# Patient Record
Sex: Female | Born: 1955 | Race: White | Hispanic: No | Marital: Single | Smoking: Former smoker
Health system: Southern US, Community
[De-identification: ages and names within clinical notes are randomized; demographics above are authoritative.]

## PROBLEM LIST (undated history)

## (undated) DIAGNOSIS — I10 Essential (primary) hypertension: Secondary | ICD-10-CM

## (undated) DIAGNOSIS — I639 Cerebral infarction, unspecified: Secondary | ICD-10-CM

## (undated) HISTORY — PX: INDUCED ABORTION: SHX677

---

## 2014-03-22 DIAGNOSIS — I639 Cerebral infarction, unspecified: Secondary | ICD-10-CM

## 2014-03-22 HISTORY — DX: Cerebral infarction, unspecified: I63.9

## 2015-02-19 ENCOUNTER — Inpatient Hospital Stay (HOSPITAL_COMMUNITY)
Admission: EM | Admit: 2015-02-19 | Discharge: 2015-02-25 | DRG: 023 | Disposition: A | Discharge status: Skilled Nursing Facility | Payer: Medicaid - Out of State | Attending: Neurology | Admitting: Neurology

## 2015-02-19 ENCOUNTER — Encounter (HOSPITAL_COMMUNITY): Payer: Self-pay | Admitting: Radiology

## 2015-02-19 ENCOUNTER — Inpatient Hospital Stay (HOSPITAL_COMMUNITY): Payer: Medicaid - Out of State | Admitting: Certified Registered"

## 2015-02-19 ENCOUNTER — Inpatient Hospital Stay (HOSPITAL_COMMUNITY): Payer: Medicaid - Out of State

## 2015-02-19 ENCOUNTER — Emergency Department (HOSPITAL_COMMUNITY): Payer: Medicaid - Out of State

## 2015-02-19 ENCOUNTER — Encounter (HOSPITAL_COMMUNITY)
Admission: EM | Disposition: A | Discharge status: Skilled Nursing Facility | Payer: Self-pay | Source: Home / Self Care | Attending: Neurology

## 2015-02-19 DIAGNOSIS — I348 Other nonrheumatic mitral valve disorders: Secondary | ICD-10-CM | POA: Diagnosis not present

## 2015-02-19 DIAGNOSIS — R531 Weakness: Secondary | ICD-10-CM | POA: Diagnosis present

## 2015-02-19 DIAGNOSIS — Z590 Homelessness: Secondary | ICD-10-CM

## 2015-02-19 DIAGNOSIS — I424 Endocardial fibroelastosis: Secondary | ICD-10-CM | POA: Diagnosis present

## 2015-02-19 DIAGNOSIS — D649 Anemia, unspecified: Secondary | ICD-10-CM | POA: Diagnosis present

## 2015-02-19 DIAGNOSIS — Q251 Coarctation of aorta: Secondary | ICD-10-CM

## 2015-02-19 DIAGNOSIS — I619 Nontraumatic intracerebral hemorrhage, unspecified: Secondary | ICD-10-CM | POA: Diagnosis present

## 2015-02-19 DIAGNOSIS — R4701 Aphasia: Secondary | ICD-10-CM | POA: Diagnosis present

## 2015-02-19 DIAGNOSIS — R414 Neurologic neglect syndrome: Secondary | ICD-10-CM | POA: Diagnosis present

## 2015-02-19 DIAGNOSIS — R1312 Dysphagia, oropharyngeal phase: Secondary | ICD-10-CM | POA: Diagnosis present

## 2015-02-19 DIAGNOSIS — R000 Tachycardia, unspecified: Secondary | ICD-10-CM | POA: Diagnosis not present

## 2015-02-19 DIAGNOSIS — E785 Hyperlipidemia, unspecified: Secondary | ICD-10-CM | POA: Diagnosis present

## 2015-02-19 DIAGNOSIS — Z87891 Personal history of nicotine dependence: Secondary | ICD-10-CM | POA: Diagnosis not present

## 2015-02-19 DIAGNOSIS — J9601 Acute respiratory failure with hypoxia: Secondary | ICD-10-CM | POA: Diagnosis not present

## 2015-02-19 DIAGNOSIS — R29719 NIHSS score 19: Secondary | ICD-10-CM | POA: Diagnosis present

## 2015-02-19 DIAGNOSIS — D151 Benign neoplasm of heart: Secondary | ICD-10-CM | POA: Diagnosis present

## 2015-02-19 DIAGNOSIS — J9690 Respiratory failure, unspecified, unspecified whether with hypoxia or hypercapnia: Secondary | ICD-10-CM

## 2015-02-19 DIAGNOSIS — I5189 Other ill-defined heart diseases: Secondary | ICD-10-CM | POA: Diagnosis present

## 2015-02-19 DIAGNOSIS — I69351 Hemiplegia and hemiparesis following cerebral infarction affecting right dominant side: Secondary | ICD-10-CM | POA: Diagnosis not present

## 2015-02-19 DIAGNOSIS — R339 Retention of urine, unspecified: Secondary | ICD-10-CM | POA: Diagnosis not present

## 2015-02-19 DIAGNOSIS — I63412 Cerebral infarction due to embolism of left middle cerebral artery: Secondary | ICD-10-CM | POA: Diagnosis present

## 2015-02-19 DIAGNOSIS — I10 Essential (primary) hypertension: Secondary | ICD-10-CM | POA: Diagnosis present

## 2015-02-19 DIAGNOSIS — E041 Nontoxic single thyroid nodule: Secondary | ICD-10-CM | POA: Diagnosis present

## 2015-02-19 DIAGNOSIS — I6789 Other cerebrovascular disease: Secondary | ICD-10-CM | POA: Diagnosis not present

## 2015-02-19 DIAGNOSIS — I611 Nontraumatic intracerebral hemorrhage in hemisphere, cortical: Secondary | ICD-10-CM | POA: Diagnosis not present

## 2015-02-19 DIAGNOSIS — R230 Cyanosis: Secondary | ICD-10-CM | POA: Diagnosis not present

## 2015-02-19 DIAGNOSIS — G936 Cerebral edema: Secondary | ICD-10-CM | POA: Diagnosis present

## 2015-02-19 DIAGNOSIS — R739 Hyperglycemia, unspecified: Secondary | ICD-10-CM | POA: Diagnosis present

## 2015-02-19 DIAGNOSIS — Z888 Allergy status to other drugs, medicaments and biological substances status: Secondary | ICD-10-CM

## 2015-02-19 DIAGNOSIS — M359 Systemic involvement of connective tissue, unspecified: Secondary | ICD-10-CM | POA: Diagnosis not present

## 2015-02-19 DIAGNOSIS — G9340 Encephalopathy, unspecified: Secondary | ICD-10-CM | POA: Diagnosis present

## 2015-02-19 DIAGNOSIS — T45615A Adverse effect of thrombolytic drugs, initial encounter: Secondary | ICD-10-CM | POA: Diagnosis not present

## 2015-02-19 DIAGNOSIS — I639 Cerebral infarction, unspecified: Secondary | ICD-10-CM | POA: Diagnosis present

## 2015-02-19 DIAGNOSIS — N939 Abnormal uterine and vaginal bleeding, unspecified: Secondary | ICD-10-CM | POA: Diagnosis present

## 2015-02-19 DIAGNOSIS — I610 Nontraumatic intracerebral hemorrhage in hemisphere, subcortical: Secondary | ICD-10-CM | POA: Diagnosis not present

## 2015-02-19 HISTORY — PX: RADIOLOGY WITH ANESTHESIA: SHX6223

## 2015-02-19 HISTORY — DX: Cerebral infarction, unspecified: I63.9

## 2015-02-19 HISTORY — DX: Essential (primary) hypertension: I10

## 2015-02-19 LAB — COMPREHENSIVE METABOLIC PANEL
ALT: 16 U/L (ref 14–54)
ANION GAP: 10 (ref 5–15)
AST: 23 U/L (ref 15–41)
Albumin: 3.9 g/dL (ref 3.5–5.0)
Alkaline Phosphatase: 100 U/L (ref 38–126)
BILIRUBIN TOTAL: 0.4 mg/dL (ref 0.3–1.2)
BUN: 10 mg/dL (ref 6–20)
CO2: 28 mmol/L (ref 22–32)
Calcium: 10.2 mg/dL (ref 8.9–10.3)
Chloride: 101 mmol/L (ref 101–111)
Creatinine, Ser: 0.96 mg/dL (ref 0.44–1.00)
GFR calc Af Amer: >60 mL/min (ref >60)
Glucose, Bld: 121 mg/dL — ABNORMAL HIGH (ref 65–99)
POTASSIUM: 3.7 mmol/L (ref 3.5–5.1)
Sodium: 139 mmol/L (ref 135–145)
TOTAL PROTEIN: 7.5 g/dL (ref 6.5–8.1)

## 2015-02-19 LAB — CBC
HCT: 40.3 % (ref 36.0–46.0)
HCT: 37.1 % (ref 36.0–46.0)
HCT: 32 % — ABNORMAL LOW (ref 36.0–46.0)
HEMATOCRIT: 35 % — AB (ref 36.0–46.0)
HEMATOCRIT: 32.5 % — AB (ref 36.0–46.0)
HEMOGLOBIN: 13.4 g/dL (ref 12.0–15.0)
HEMOGLOBIN: 12.6 g/dL (ref 12.0–15.0)
HEMOGLOBIN: 11.7 g/dL — AB (ref 12.0–15.0)
HEMOGLOBIN: 10.8 g/dL — AB (ref 12.0–15.0)
Hemoglobin: 10.5 g/dL — ABNORMAL LOW (ref 12.0–15.0)
MCH: 27.5 pg (ref 26.0–34.0)
MCH: 27.5 pg (ref 26.0–34.0)
MCH: 27.3 pg (ref 26.0–34.0)
MCH: 27.5 pg (ref 26.0–34.0)
MCH: 27.1 pg (ref 26.0–34.0)
MCHC: 33.3 g/dL (ref 30.0–36.0)
MCHC: 34 g/dL (ref 30.0–36.0)
MCHC: 33.4 g/dL (ref 30.0–36.0)
MCHC: 33.2 g/dL (ref 30.0–36.0)
MCHC: 32.8 g/dL (ref 30.0–36.0)
MCV: 82.6 fL (ref 78.0–100.0)
MCV: 80.8 fL (ref 78.0–100.0)
MCV: 81.8 fL (ref 78.0–100.0)
MCV: 82.7 fL (ref 78.0–100.0)
MCV: 82.7 fL (ref 78.0–100.0)
PLATELETS: 179 10*3/uL (ref 150–400)
Platelets: 178 10*3/uL (ref 150–400)
Platelets: 169 10*3/uL (ref 150–400)
Platelets: 184 10*3/uL (ref 150–400)
Platelets: 187 10*3/uL (ref 150–400)
RBC: 4.88 MIL/uL (ref 3.87–5.11)
RBC: 4.59 MIL/uL (ref 3.87–5.11)
RBC: 4.28 MIL/uL (ref 3.87–5.11)
RBC: 3.93 MIL/uL (ref 3.87–5.11)
RBC: 3.87 MIL/uL (ref 3.87–5.11)
RDW: 13.7 % (ref 11.5–15.5)
RDW: 13.8 % (ref 11.5–15.5)
RDW: 13.7 % (ref 11.5–15.5)
RDW: 13.7 % (ref 11.5–15.5)
RDW: 13.8 % (ref 11.5–15.5)
WBC: 10.2 10*3/uL (ref 4.0–10.5)
WBC: 10.9 10*3/uL — AB (ref 4.0–10.5)
WBC: 13.4 10*3/uL — AB (ref 4.0–10.5)
WBC: 13.3 10*3/uL — ABNORMAL HIGH (ref 4.0–10.5)
WBC: 11.7 10*3/uL — ABNORMAL HIGH (ref 4.0–10.5)

## 2015-02-19 LAB — POCT I-STAT 3, ART BLOOD GAS (G3+)
ACID-BASE EXCESS: 1 mmol/L (ref 0.0–2.0)
Acid-Base Excess: 2 mmol/L (ref 0.0–2.0)
BICARBONATE: 27 meq/L — AB (ref 20.0–24.0)
Bicarbonate: 24 mEq/L (ref 20.0–24.0)
O2 SAT: 99 %
O2 Saturation: 100 %
PO2 ART: 352 mmHg — AB (ref 80.0–100.0)
TCO2: 25 mmol/L (ref 0–100)
TCO2: 28 mmol/L (ref 0–100)
pCO2 arterial: 31.4 mmHg — ABNORMAL LOW (ref 35.0–45.0)
pCO2 arterial: 41.1 mmHg (ref 35.0–45.0)
pH, Arterial: 7.492 — ABNORMAL HIGH (ref 7.350–7.450)
pH, Arterial: 7.423 (ref 7.350–7.450)
pO2, Arterial: 143 mmHg — ABNORMAL HIGH (ref 80.0–100.0)

## 2015-02-19 LAB — I-STAT CHEM 8, ED
BUN: 13 mg/dL (ref 6–20)
CALCIUM ION: 1.23 mmol/L (ref 1.12–1.23)
Chloride: 100 mmol/L — ABNORMAL LOW (ref 101–111)
Creatinine, Ser: 1 mg/dL (ref 0.44–1.00)
GLUCOSE: 122 mg/dL — AB (ref 65–99)
HCT: 43 % (ref 36.0–46.0)
HEMOGLOBIN: 14.6 g/dL (ref 12.0–15.0)
Potassium: 3.7 mmol/L (ref 3.5–5.1)
Sodium: 140 mmol/L (ref 135–145)
TCO2: 28 mmol/L (ref 0–100)

## 2015-02-19 LAB — DIFFERENTIAL
Basophils Absolute: 0.1 10*3/uL (ref 0.0–0.1)
Basophils Relative: 1 %
EOS ABS: 0.4 10*3/uL (ref 0.0–0.7)
EOS PCT: 4 %
LYMPHS ABS: 4.2 10*3/uL — AB (ref 0.7–4.0)
Lymphocytes Relative: 41 %
MONOS PCT: 6 %
Monocytes Absolute: 0.6 10*3/uL (ref 0.1–1.0)
NEUTROS PCT: 48 %
Neutro Abs: 5 10*3/uL (ref 1.7–7.7)

## 2015-02-19 LAB — PROTIME-INR
INR: 1.11 (ref 0.00–1.49)
Prothrombin Time: 14.5 seconds (ref 11.6–15.2)

## 2015-02-19 LAB — I-STAT TROPONIN, ED: TROPONIN I, POC: 0.52 ng/mL — AB (ref 0.00–0.08)

## 2015-02-19 LAB — APTT: aPTT: 25 seconds (ref 24–37)

## 2015-02-19 LAB — TRIGLYCERIDES: TRIGLYCERIDES: 448 mg/dL — AB (ref <150)

## 2015-02-19 LAB — CBG MONITORING, ED: GLUCOSE-CAPILLARY: 112 mg/dL — AB (ref 65–99)

## 2015-02-19 SURGERY — RADIOLOGY WITH ANESTHESIA
Anesthesia: General

## 2015-02-19 MED ORDER — CEFAZOLIN SODIUM-DEXTROSE 2-3 GM-% IV SOLR
INTRAVENOUS | Status: DC | PRN
  Administered 2015-02-19: 2 g via INTRAVENOUS

## 2015-02-19 MED ORDER — NITROGLYCERIN 1 MG/10 ML FOR IR/CATH LAB
INTRA_ARTERIAL | Status: AC
  Filled 2015-02-19: qty 10

## 2015-02-19 MED ORDER — ESMOLOL HCL 100 MG/10ML IV SOLN
INTRAVENOUS | Status: DC | PRN
  Administered 2015-02-19: 40 mg via INTRAVENOUS
  Administered 2015-02-19 (×2): 30 mg via INTRAVENOUS

## 2015-02-19 MED ORDER — ONDANSETRON HCL 4 MG/2ML IJ SOLN: 4.0000 mg | Freq: Four times a day (QID) | INTRAMUSCULAR | Status: DC | PRN

## 2015-02-19 MED ORDER — LIDOCAINE HCL (CARDIAC) 20 MG/ML IV SOLN
INTRAVENOUS | Status: DC | PRN
  Administered 2015-02-19: 100 mg via INTRAVENOUS

## 2015-02-19 MED ORDER — LACTATED RINGERS IV SOLN
INTRAVENOUS | Status: DC | PRN
Start: 2015-02-19 — End: 2015-02-19
  Administered 2015-02-19: 06:00:00 via INTRAVENOUS

## 2015-02-19 MED ORDER — NICARDIPINE HCL IN NACL 20-0.86 MG/200ML-% IV SOLN: 3.0000 mg/h | INTRAVENOUS | Status: DC

## 2015-02-19 MED ORDER — ACETAMINOPHEN 500 MG PO TABS: 1000.0000 mg | ORAL_TABLET | Freq: Four times a day (QID) | ORAL | Status: DC | PRN

## 2015-02-19 MED ORDER — PROPOFOL 10 MG/ML IV BOLUS
INTRAVENOUS | Status: AC
  Filled 2015-02-19: qty 20

## 2015-02-19 MED ORDER — PROPOFOL 500 MG/50ML IV EMUL
INTRAVENOUS | Status: DC | PRN
  Administered 2015-02-19: 08:00:00 via INTRAVENOUS
  Administered 2015-02-19: 50 ug/kg/min via INTRAVENOUS

## 2015-02-19 MED ORDER — SENNOSIDES-DOCUSATE SODIUM 8.6-50 MG PO TABS
1.0000 | ORAL_TABLET | Freq: Every evening | ORAL | Status: DC | PRN
  Filled 2015-02-19: qty 1

## 2015-02-19 MED ORDER — SODIUM CHLORIDE 0.9 % IV SOLN
INTRAVENOUS | Status: DC
  Administered 2015-02-19 (×2): via INTRAVENOUS
  Administered 2015-02-20: 1000 mL via INTRAVENOUS
  Administered 2015-02-21 – 2015-02-22 (×2): via INTRAVENOUS

## 2015-02-19 MED ORDER — ALTEPLASE 30 MG/30 ML FOR INTERV. RAD
1.0000 mg | INTRA_ARTERIAL | Status: AC
  Administered 2015-02-19: 2.4 mg via INTRA_ARTERIAL
  Filled 2015-02-19: qty 30

## 2015-02-19 MED ORDER — LABETALOL HCL 5 MG/ML IV SOLN: 10.0000 mg | INTRAVENOUS | Status: DC | PRN

## 2015-02-19 MED ORDER — CHLORHEXIDINE GLUCONATE 0.12 % MT SOLN
15.0000 mL | Freq: Two times a day (BID) | OROMUCOSAL | Status: DC
  Administered 2015-02-19 – 2015-02-20 (×4): 15 mL via OROMUCOSAL

## 2015-02-19 MED ORDER — FENTANYL CITRATE (PF) 250 MCG/5ML IJ SOLN
INTRAMUSCULAR | Status: DC | PRN
  Administered 2015-02-19 (×2): 25 ug via INTRAVENOUS
  Administered 2015-02-19: 50 ug via INTRAVENOUS
  Administered 2015-02-19: 25 ug via INTRAVENOUS
  Administered 2015-02-19: 100 ug via INTRAVENOUS
  Administered 2015-02-19: 25 ug via INTRAVENOUS

## 2015-02-19 MED ORDER — ACETAMINOPHEN 650 MG RE SUPP
650.0000 mg | Freq: Four times a day (QID) | RECTAL | Status: DC | PRN
  Administered 2015-02-22: 650 mg via RECTAL
  Filled 2015-02-19: qty 1

## 2015-02-19 MED ORDER — PANTOPRAZOLE SODIUM 40 MG IV SOLR
40.0000 mg | Freq: Every day | INTRAVENOUS | Status: DC
Start: 2015-02-19 — End: 2015-02-25
  Administered 2015-02-19 – 2015-02-24 (×6): 40 mg via INTRAVENOUS
  Filled 2015-02-19 (×7): qty 40

## 2015-02-19 MED ORDER — NICARDIPINE HCL IN NACL 20-0.86 MG/200ML-% IV SOLN
5.0000 mg/h | INTRAVENOUS | Status: DC
  Administered 2015-02-19: 5 mg/h via INTRAVENOUS
  Filled 2015-02-19 (×2): qty 200

## 2015-02-19 MED ORDER — PROPOFOL 10 MG/ML IV BOLUS
INTRAVENOUS | Status: DC | PRN
  Administered 2015-02-19: 140 mg via INTRAVENOUS

## 2015-02-19 MED ORDER — ALTEPLASE (STROKE) FULL DOSE INFUSION
0.9000 mg/kg | Freq: Once | INTRAVENOUS | Status: AC
  Administered 2015-02-19: 57 mg via INTRAVENOUS
  Filled 2015-02-19: qty 57

## 2015-02-19 MED ORDER — SODIUM CHLORIDE 0.9 % IV SOLN
50.0000 mL | Freq: Once | INTRAVENOUS | Status: AC
  Administered 2015-02-19: 50 mL via INTRAVENOUS

## 2015-02-19 MED ORDER — PHENYLEPHRINE HCL 10 MG/ML IJ SOLN
10.0000 mg | INTRAVENOUS | Status: DC | PRN
  Administered 2015-02-19: 15 ug/min via INTRAVENOUS

## 2015-02-19 MED ORDER — FENTANYL CITRATE (PF) 250 MCG/5ML IJ SOLN
INTRAMUSCULAR | Status: AC
  Filled 2015-02-19: qty 5

## 2015-02-19 MED ORDER — HEPARIN SODIUM (PORCINE) 1000 UNIT/ML IJ SOLN
INTRAMUSCULAR | Status: AC
  Filled 2015-02-19: qty 1

## 2015-02-19 MED ORDER — NICARDIPINE HCL IN NACL 40-0.83 MG/200ML-% IV SOLN
5.0000 mg/h | INTRAVENOUS | Status: DC
  Administered 2015-02-19: 10 mg/h via INTRAVENOUS
  Administered 2015-02-19 – 2015-02-20 (×2): 5 mg/h via INTRAVENOUS
  Filled 2015-02-19 (×4): qty 200

## 2015-02-19 MED ORDER — PROPOFOL 1000 MG/100ML IV EMUL
INTRAVENOUS | Status: AC
  Administered 2015-02-19: 1000 mg
  Filled 2015-02-19: qty 100

## 2015-02-19 MED ORDER — CETYLPYRIDINIUM CHLORIDE 0.05 % MT LIQD
7.0000 mL | Freq: Two times a day (BID) | OROMUCOSAL | Status: DC
  Administered 2015-02-19 – 2015-02-21 (×5): 7 mL via OROMUCOSAL

## 2015-02-19 MED ORDER — IOHEXOL 350 MG/ML SOLN
50.0000 mL | Freq: Once | INTRAVENOUS | Status: AC | PRN
  Administered 2015-02-19: 50 mL via INTRAVENOUS

## 2015-02-19 MED ORDER — IOHEXOL 300 MG/ML  SOLN
150.0000 mL | Freq: Once | INTRAMUSCULAR | Status: AC | PRN
  Administered 2015-02-19: 70 mL via INTRAVENOUS

## 2015-02-19 MED ORDER — STROKE: EARLY STAGES OF RECOVERY BOOK
Freq: Once | Status: AC
Start: 2015-02-19 — End: 2015-02-19
  Administered 2015-02-19: 06:00:00
  Filled 2015-02-19: qty 1

## 2015-02-19 MED ORDER — LABETALOL HCL 5 MG/ML IV SOLN
INTRAVENOUS | Status: DC | PRN
  Administered 2015-02-19 (×2): 10 mg via INTRAVENOUS

## 2015-02-19 MED ORDER — LABETALOL HCL 5 MG/ML IV SOLN
INTRAVENOUS | Status: AC
  Administered 2015-02-19: 10 mg via INTRAVENOUS
  Filled 2015-02-19: qty 4

## 2015-02-19 MED ORDER — CEFAZOLIN SODIUM-DEXTROSE 2-3 GM-% IV SOLR
INTRAVENOUS | Status: AC
  Filled 2015-02-19: qty 50

## 2015-02-19 MED ORDER — PROPOFOL BOLUS VIA INFUSION: 0.5000 mg/kg | INTRAVENOUS | Status: DC

## 2015-02-19 MED ORDER — SODIUM CHLORIDE 0.9 % IV SOLN
INTRAVENOUS | Status: DC
  Administered 2015-02-19 (×2): 75 mL/h via INTRAVENOUS

## 2015-02-19 MED ORDER — ACETAMINOPHEN 325 MG PO TABS: 650.0000 mg | ORAL_TABLET | ORAL | Status: DC | PRN

## 2015-02-19 MED ORDER — ROCURONIUM BROMIDE 100 MG/10ML IV SOLN
INTRAVENOUS | Status: DC | PRN
  Administered 2015-02-19: 20 mg via INTRAVENOUS
  Administered 2015-02-19: 30 mg via INTRAVENOUS
  Administered 2015-02-19: 50 mg via INTRAVENOUS

## 2015-02-19 MED ORDER — ACETAMINOPHEN 650 MG RE SUPP: 650.0000 mg | RECTAL | Status: DC | PRN

## 2015-02-19 MED ORDER — PROPOFOL 1000 MG/100ML IV EMUL
0.0000 ug/kg/min | INTRAVENOUS | Status: DC
  Administered 2015-02-19 (×2): 40 ug/kg/min via INTRAVENOUS
  Administered 2015-02-19: 37 ug/kg/min via INTRAVENOUS
  Administered 2015-02-20 (×2): 40 ug/kg/min via INTRAVENOUS
  Filled 2015-02-19: qty 200
  Filled 2015-02-19 (×2): qty 100

## 2015-02-19 MED ORDER — SUCCINYLCHOLINE CHLORIDE 20 MG/ML IJ SOLN
INTRAMUSCULAR | Status: DC | PRN
  Administered 2015-02-19: 120 mg via INTRAVENOUS

## 2015-02-19 MED ORDER — IOHEXOL 300 MG/ML  SOLN
150.0000 mL | Freq: Once | INTRAMUSCULAR | Status: AC | PRN
  Administered 2015-02-19: 20 mL via INTRAVENOUS

## 2015-02-19 NOTE — ED Provider Notes (Addendum)
CSN: [id number]     Arrival date & time 02/19/15  0401 History  By signing my name below, I, Evelene Croon, attest that this documentation has been prepared under the direction and in the presence of Everlene Balls, MD . Electronically Signed: Evelene Croon, Scribe. 02/19/2015. 4:16 AM.  Chief Complaint  Patient presents with  . Code Stroke   LEVEL 5 CAVEAT DUE TO ACUITY OF MEDICAL CONDITION  The history is provided by the EMS personnel. No language interpreter was used.    HPI Comments:  Erin Galloway is a 59 y.o. female brought in by ambulance, who presents to the Emergency Department for right sided deficits onset ~ 0300 this AM. EMS notes symptoms were noticed by husband who reported to EMS decreased speech and h/o CVA with mild right sided residual deficit from previous stroke. EMS reports BP of 166/92 and placed IV in RUE.   No past medical history on file. No past surgical history on file. No family history on file. Social History  Substance Use Topics  . Smoking status: Not on file  . Smokeless tobacco: Not on file  . Alcohol Use: Not on file   OB History    No data available     Review of Systems  Unable to perform ROS: Acuity of condition    Allergies  Review of patient's allergies indicates not on file.  Home Medications   Prior to Admission medications   Not on File   Ht 5\' 4"  (1.626 m)  Wt 140 lb (63.504 kg)  BMI 24.02 kg/m2 Physical Exam  Constitutional: She appears well-developed and well-nourished. She appears distressed.  HENT:  Head: Normocephalic and atraumatic.  Nose: Nose normal.  Mouth/Throat: Oropharynx is clear and moist. No oropharyngeal exudate.  Eyes: Conjunctivae and EOM are normal. Pupils are equal, round, and reactive to light. No scleral icterus.  Left gaze deviation  Neck: Normal range of motion. Neck supple. No JVD present. No tracheal deviation present. No thyromegaly present.  Cardiovascular: Normal rate, regular rhythm and  normal heart sounds.  Exam reveals no gallop and no friction rub.   No murmur heard. Pulmonary/Chest: Effort normal and breath sounds normal. No respiratory distress. She has no wheezes. She exhibits no tenderness.  Abdominal: Soft. Bowel sounds are normal. She exhibits no distension and no mass. There is no tenderness. There is no rebound and no guarding.  Musculoskeletal: Normal range of motion. She exhibits no edema or tenderness.  Lymphadenopathy:    She has no cervical adenopathy.  Neurological: She is alert.  Right sided hemi neglect Right sided weakness  Skin: Skin is warm and dry. No rash noted. No erythema. No pallor.  Nursing note and vitals reviewed.   ED Course  Procedures   DIAGNOSTIC STUDIES:  Oxygen Saturation is 97% on RA, normal by my interpretation.     Labs Review Labs Reviewed  DIFFERENTIAL - Abnormal; Notable for the following:    Lymphs Abs 4.2 (*)    All other components within normal limits  COMPREHENSIVE METABOLIC PANEL - Abnormal; Notable for the following:    Glucose, Bld 121 (*)    All other components within normal limits  I-STAT TROPOININ, ED - Abnormal; Notable for the following:    Troponin i, poc 0.52 (*)    All other components within normal limits  CBG MONITORING, ED - Abnormal; Notable for the following:    Glucose-Capillary 112 (*)    All other components within normal limits  I-STAT CHEM 8, ED -  Abnormal; Notable for the following:    Chloride 100 (*)    Glucose, Bld 122 (*)    All other components within normal limits  PROTIME-INR  APTT  CBC    Imaging Review Ct Head Wo Contrast  02/19/2015  CLINICAL DATA:  Right-sided weakness, last seen normal at 0 3:00 EXAM: CT HEAD WITHOUT CONTRAST TECHNIQUE: Contiguous axial images were obtained from the base of the skull through the vertex without intravenous contrast. COMPARISON:  None. FINDINGS: There is no intracranial hemorrhage or extra-axial fluid collection. There is encephalomalacia  due to remote infarction in the posterior left frontal lobe. There is remote infarction in the right parieto-occipital region and possibly a smaller area of remote infarction in the left occipital lobe. No acute infarction is evident. There is mild generalized atrophy. There is white matter hypodensity suggesting chronic small vessel disease. No bone abnormality is evident. The visible paranasal sinuses are clear. IMPRESSION: Remote infarctions with encephalomalacia. Generalized atrophy and chronic white matter changes consistent with small vessel disease. No acute intracranial findings. Critical Value/emergent results were called by telephone at the time of interpretation on 02/19/2015 at 4:25 am to Dr. Everlene Balls , who verbally acknowledged these results. Electronically Signed   By: Andreas Newport M.D.   On: 02/19/2015 04:28   I have personally reviewed and evaluated these images and lab results as part of my medical decision-making.   EKG Interpretation   Date/Time:  Wednesday February 19 2015 04:49:07 EST Ventricular Rate:  91 PR Interval:  154 QRS Duration: 92 QT Interval:  392 QTC Calculation: 482 R Axis:   80 Text Interpretation:  Sinus rhythm Left atrial enlargement Borderline T  abnormalities, inferior leads No old tracing to compare Confirmed by Glynn Octave [phone]) on 02/19/2015 5:11:33 AM      MDM   Final diagnoses:  Stroke with cerebral ischemia [facility]  Stroke with cerebral ischemia [facility])   Patient presents to the emergency department for stroke symptoms. She is still in the windows this began at 3 AM. Code stroke was activated, CT scan of her head is negative for any bleeding. She continues to have significant left neglect with weakness on the right side as well. Per family, this may be due to a growth in her heart that has caused an embolus. Neurologist evaluated the patient, CTA was ordered which shows possible clot. Will attempt IR intervention. TPA was ordered  by neurology as well. Patient remains critical B admitted to ICU for further care.   CRITICAL CARE Performed by: Everlene Balls   Total critical care time: 40 minutes - CVA, tpa given  Critical care time was exclusive of separately billable procedures and treating other patients.  Critical care was necessary to treat or prevent imminent or life-threatening deterioration.  Critical care was time spent personally by me on the following activities: development of treatment plan with patient and/or surrogate as well as nursing, discussions with consultants, evaluation of patient's response to treatment, examination of patient, obtaining history from patient or surrogate, ordering and performing treatments and interventions, ordering and review of laboratory studies, ordering and review of radiographic studies, pulse oximetry and re-evaluation of patient's condition.    I personally performed the services described in this documentation, which was scribed in my presence. The recorded information has been reviewed and is accurate.    Everlene Balls, MD 02/19/15 [phone]

## 2015-02-19 NOTE — Progress Notes (Signed)
  Echocardiogram 2D Echocardiogram has been performed.  Erin Galloway 02/19/2015, 12:07 PM

## 2015-02-19 NOTE — ED Notes (Signed)
Per pts husband the pt has been having some vaginal bleeding, the last time was 2 days ago.

## 2015-02-19 NOTE — ED Notes (Signed)
Per [facility]: Pt was talking in bed with her boyfriend at 0300 when she stopped talking and started having right sided arm droop. While en route to the hospital the pt started resisting with her right arm, while inserting IV.

## 2015-02-19 NOTE — Transfer of Care (Signed)
Immediate Anesthesia Transfer of Care Note  Patient: Erin Galloway  Procedure(s) Performed: Procedure(s): RADIOLOGY WITH ANESTHESIA (N/A)  Patient Location: PACU  Anesthesia Type:General  Level of Consciousness: sedated and Patient remains intubated per anesthesia plan  Airway & Oxygen Therapy: Patient remains intubated per anesthesia plan and Patient placed on Ventilator (see vital sign flow sheet for setting)  Post-op Assessment: Report given to RN and Post -op Vital signs reviewed and stable  Post vital signs: Reviewed and stable  Last Vitals:  Filed Vitals:   02/19/15 0537 02/19/15 0550  BP:  170/92  Pulse: 90 88  Temp:  36.8 C  Resp: 19 19    Complications: No apparent anesthesia complications

## 2015-02-19 NOTE — Consult Note (Signed)
PULMONARY / CRITICAL CARE MEDICINE   Name: Erin Galloway MRN: [id number] DOB: [birth year]    ADMISSION DATE:  02/19/2015 CONSULTATION DATE:  11/30  REFERRING MD : IR  CHIEF COMPLAINT:  Rt side weakness, code stroke  HISTORY OF PRESENT ILLNESS:   59 yo wf , former smoker, recently moved to [place], hx of stroke with some residual rt side weakness. Boyfriend Noted abrupt onset of rt side weakness, left gaze preference and code stroke was activated. MCA clot retrieval per IR 0700 11/30. Sedated on vent. Very little information available for PMH/  Sedated and with NMB on board. PCCM asked to manage vent. Note some question of vaginal bleeding, ? From femoral artery cannulation. We will monitor closely in ICU.  PAST MEDICAL HISTORY :  She  has a past medical history of Stroke ([place]) (2016) and Hypertension.  PAST SURGICAL HISTORY: She  has past surgical history that includes Induced abortion.  No Known Allergies  No current facility-administered medications on file prior to encounter.   No current outpatient prescriptions on file prior to encounter.    FAMILY HISTORY:  Her has no family status information on file.   SOCIAL HISTORY: She  reports that she has quit smoking. She has never used smokeless tobacco. She reports that she does not drink alcohol or use illicit drugs. Limited due to intubation REVIEW OF SYSTEMS:   Not available   SUBJECTIVE:   VITAL SIGNS: BP 170/92 mmHg  Pulse 88  Temp(Src) 98.2 F (36.8 C)  Resp 19  Ht 5\' 4"  (1.626 m)  Wt 139 lb (63.05 kg)  BMI 23.85 kg/m2  SpO2 100%  HEMODYNAMICS:    VENTILATOR SETTINGS:    INTAKE / OUTPUT:    PHYSICAL EXAMINATION: General:  wnwd wf sedated on vent with nmb on board Neuro:  Sedated, NMB HEENT: PERL 4 mm, ott-> vent Cardiovascular: HSR RRR Lungs: CTA Abdomen:  Soft +bs Musculoskeletal:  intact Skin: warm and dry, decreased lower ext pulses  LABS:  CBC  Recent Labs Lab 02/19/15 0404  02/19/15 0414  WBC 10.2  --   HGB 13.4 14.6  HCT 40.3 43.0  PLT 178  --    Coag's  Recent Labs Lab 02/19/15 0404  APTT 25  INR 1.11   BMET  Recent Labs Lab 02/19/15 0404 02/19/15 0414  NA 139 140  K 3.7 3.7  CL 101 100*  CO2 28  --   BUN 10 13  CREATININE 0.96 1.00  GLUCOSE 121* 122*   Electrolytes  Recent Labs Lab 02/19/15 0404  CALCIUM 10.2   Sepsis Markers No results for input(s): LATICACIDVEN, PROCALCITON, O2SATVEN in the last 168 hours. ABG No results for input(s): PHART, PCO2ART, PO2ART in the last 168 hours. Liver Enzymes  Recent Labs Lab 02/19/15 0404  AST 23  ALT 16  ALKPHOS 100  BILITOT 0.4  ALBUMIN 3.9   Cardiac Enzymes No results for input(s): TROPONINI, PROBNP in the last 168 hours. Glucose  Recent Labs Lab 02/19/15 0451  GLUCAP 112*    Imaging Ct Angio Head W/cm &/or Wo Cm  02/19/2015  CLINICAL DATA:  Initial evaluation for acute onset right-sided weakness with aphasia. EXAM: CT ANGIOGRAPHY HEAD AND NECK TECHNIQUE: Multidetector CT imaging of the head and neck was performed using the standard protocol during bolus administration of intravenous contrast. Multiplanar CT image reconstructions and MIPs were obtained to evaluate the vascular anatomy. Carotid stenosis measurements (when applicable) are obtained utilizing NASCET criteria, using the distal internal carotid diameter  as the denominator. CONTRAST:  43mL OMNIPAQUE IOHEXOL 350 MG/ML SOLN COMPARISON:  Prior noncontrast head CT from earlier the same day. FINDINGS: CTA NECK Aortic arch: Mild scattered plaque present within the visualize aortic arch. The arch itself is of normal caliber with normal 3 vessel morphology. No high-grade stenosis seen at the origin of the great vessels. Atheromatous plaque at the origin of the right subclavian artery without high-grade stenosis. Right carotid system: Right common carotid artery widely patent from its origin to the carotid bifurcation.  Atheromatous plaque about the carotid bifurcation/proximal right ICA without hemodynamically significant stenosis. Right ICA is widely patent to the skullbase. Left carotid system: Left common carotid artery widely patent from its origin to the carotid bifurcation. Scattered calcified atheromatous plaque about the left carotid bifurcation extending into the proximal left ICA. Associated short-segment stenosis of up to 40-50% by NASCET criteria within the proximal left ICA. Left ICA well opacified distally to the skullbase. Vertebral arteries:Both vertebral arteries arise from the subclavian arteries. Right vertebral artery is dominant. There is focal plaque at the origin of the left vertebral artery with probable severe stenosis dominant right vertebral artery widely patent at its origin. Vertebral arteries otherwise well opacified to the skullbase without stenosis or evidence of dissection. The left vertebral artery is diffusely diminutive. Skeleton: No acute osseous abnormality. No worrisome lytic or blastic osseous lesions. Mild degenerative spondylolysis within the cervical spine, most evident at C4-5. Other neck: Visualized lungs are clear. Scattered hypodense nodules noted within the thyroid. No adenopathy. No acute soft tissue abnormality within the neck. CTA HEAD Anterior circulation: Petrous segments are widely patent. Multi focal atheromatous plaque with irregularity present within the cavernous/ supraclinoid ICAs with associated mild to moderate multi focal narrowing. ICA termini widely patent. A1 segments well opacified. Anterior communicating artery normal. Anterior cerebral arteries well opacified. On the left, there is a focal filling defect within the mid left M1 segment, suspicious for nonocclusive thrombus (series 6, image 223). There is opacification of the M1 segment distally, although flow was somewhat attenuated and irregular. Left MCA bifurcation is normal. Flow is seen within proximal left M2  branches, however, there is minimal to stent flow distally within the left MCA branches as compared to the right. Right M1 segment widely patent without stenosis or occlusion. Right MCA bifurcation normal. Right MCA branches well opacified. Posterior circulation: Dominant right vertebral artery widely patent to the vertebrobasilar junction. Diminutive left vertebral artery demonstrates multi focal irregularity with narrowing but is patent as well. Posterior inferior cerebral arteries patent bilaterally. Basilar artery somewhat diminutive but widely patent. Superior cerebral arteries opacified proximally. There is fetal origin of the right PCA with a widely patent right posterior communicating artery. Right PCA is opacified to its distal aspect. Left P1 and P2 segments well opacified. A small left posterior communicating artery present as well. Venous sinuses: No filling defect to suggest venous sinus thrombosis. Anatomic variants: Fetal origin of the right PCA.  No aneurysm. Delayed phase: Not performed. IMPRESSION: CTA NECK IMPRESSION: 1. Atheromatous plaque about the left carotid bifurcation/proximal left ICA with associated short segment stenosis of approximately 40-50% by NASCET criteria. 2. Mild atheromatous plaque about the right carotid bifurcation/proximal right ICA without hemodynamically significant stenosis. 3. 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 The dominant right vertebral artery is widely patent along its entire course. CTA HEAD IMPRESSION: 1. Short segment focal defect within the mid left M1 segment, worrisome for nonocclusive thrombus. There  may be a superimposed stenosis at this level as well. Flow is present within the left M1 segment distally as well as the proximal M2 branches, however, the MCA branches are markedly attenuated and possibly occluded distally. 2. Atheromatous disease within the cavernous ICAs with associated mild  to moderate multi focal irregularity and stenosis. 3. Irregularity with multi focal moderate to severe narrowing of the left V4 segment. Dominant right vertebral artery widely patent. Critical Value/emergent results were called by telephone at the time of interpretation on 02/19/2015 at 4:56 am to Dr. Dorian Pod , who verbally acknowledged these results. Electronically Signed   By: Jeannine Boga M.D.   On: 02/19/2015 05:50   Ct Head Wo Contrast  02/19/2015  CLINICAL DATA:  Right-sided weakness, last seen normal at 0 3:00 EXAM: CT HEAD WITHOUT CONTRAST TECHNIQUE: Contiguous axial images were obtained from the base of the skull through the vertex without intravenous contrast. COMPARISON:  None. FINDINGS: There is no intracranial hemorrhage or extra-axial fluid collection. There is encephalomalacia due to remote infarction in the posterior left frontal lobe. There is remote infarction in the right parieto-occipital region and possibly a smaller area of remote infarction in the left occipital lobe. No acute infarction is evident. There is mild generalized atrophy. There is white matter hypodensity suggesting chronic small vessel disease. No bone abnormality is evident. The visible paranasal sinuses are clear. IMPRESSION: Remote infarctions with encephalomalacia. Generalized atrophy and chronic white matter changes consistent with small vessel disease. No acute intracranial findings. Critical Value/emergent results were called by telephone at the time of interpretation on 02/19/2015 at 4:25 am to Dr. Everlene Balls , who verbally acknowledged these results. Electronically Signed   By: Andreas Newport M.D.   On: 02/19/2015 04:28   Ct Angio Neck W/cm &/or Wo/cm  02/19/2015  CLINICAL DATA:  Initial evaluation for acute onset right-sided weakness with aphasia. EXAM: CT ANGIOGRAPHY HEAD AND NECK TECHNIQUE: Multidetector CT imaging of the head and neck was performed using the standard protocol during bolus  administration of intravenous contrast. Multiplanar CT image reconstructions and MIPs were obtained to evaluate the vascular anatomy. Carotid stenosis measurements (when applicable) are obtained utilizing NASCET criteria, using the distal internal carotid diameter as the denominator. CONTRAST:  24mL OMNIPAQUE IOHEXOL 350 MG/ML SOLN COMPARISON:  Prior noncontrast head CT from earlier the same day. FINDINGS: CTA NECK Aortic arch: Mild scattered plaque present within the visualize aortic arch. The arch itself is of normal caliber with normal 3 vessel morphology. No high-grade stenosis seen at the origin of the great vessels. Atheromatous plaque at the origin of the right subclavian artery without high-grade stenosis. Right carotid system: Right common carotid artery widely patent from its origin to the carotid bifurcation. Atheromatous plaque about the carotid bifurcation/proximal right ICA without hemodynamically significant stenosis. Right ICA is widely patent to the skullbase. Left carotid system: Left common carotid artery widely patent from its origin to the carotid bifurcation. Scattered calcified atheromatous plaque about the left carotid bifurcation extending into the proximal left ICA. Associated short-segment stenosis of up to 40-50% by NASCET criteria within the proximal left ICA. Left ICA well opacified distally to the skullbase. Vertebral arteries:Both vertebral arteries arise from the subclavian arteries. Right vertebral artery is dominant. There is focal plaque at the origin of the left vertebral artery with probable severe stenosis dominant right vertebral artery widely patent at its origin. Vertebral arteries otherwise well opacified to the skullbase without stenosis or evidence of dissection. The left vertebral artery is  diffusely diminutive. Skeleton: No acute osseous abnormality. No worrisome lytic or blastic osseous lesions. Mild degenerative spondylolysis within the cervical spine, most evident at  C4-5. Other neck: Visualized lungs are clear. Scattered hypodense nodules noted within the thyroid. No adenopathy. No acute soft tissue abnormality within the neck. CTA HEAD Anterior circulation: Petrous segments are widely patent. Multi focal atheromatous plaque with irregularity present within the cavernous/ supraclinoid ICAs with associated mild to moderate multi focal narrowing. ICA termini widely patent. A1 segments well opacified. Anterior communicating artery normal. Anterior cerebral arteries well opacified. On the left, there is a focal filling defect within the mid left M1 segment, suspicious for nonocclusive thrombus (series 6, image 223). There is opacification of the M1 segment distally, although flow was somewhat attenuated and irregular. Left MCA bifurcation is normal. Flow is seen within proximal left M2 branches, however, there is minimal to stent flow distally within the left MCA branches as compared to the right. Right M1 segment widely patent without stenosis or occlusion. Right MCA bifurcation normal. Right MCA branches well opacified. Posterior circulation: Dominant right vertebral artery widely patent to the vertebrobasilar junction. Diminutive left vertebral artery demonstrates multi focal irregularity with narrowing but is patent as well. Posterior inferior cerebral arteries patent bilaterally. Basilar artery somewhat diminutive but widely patent. Superior cerebral arteries opacified proximally. There is fetal origin of the right PCA with a widely patent right posterior communicating artery. Right PCA is opacified to its distal aspect. Left P1 and P2 segments well opacified. A small left posterior communicating artery present as well. Venous sinuses: No filling defect to suggest venous sinus thrombosis. Anatomic variants: Fetal origin of the right PCA.  No aneurysm. Delayed phase: Not performed. IMPRESSION: CTA NECK IMPRESSION: 1. Atheromatous plaque about the left carotid  bifurcation/proximal left ICA with associated short segment stenosis of approximately 40-50% by NASCET criteria. 2. Mild atheromatous plaque about the right carotid bifurcation/proximal right ICA without hemodynamically significant stenosis. 3. 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 The dominant right vertebral artery is widely patent along its entire course. CTA HEAD IMPRESSION: 1. Short segment focal defect within the mid left M1 segment, worrisome for nonocclusive thrombus. There may be a superimposed stenosis at this level as well. Flow is present within the left M1 segment distally as well as the proximal M2 branches, however, the MCA branches are markedly attenuated and possibly occluded distally. 2. Atheromatous disease within the cavernous ICAs with associated mild to moderate multi focal irregularity and stenosis. 3. Irregularity with multi focal moderate to severe narrowing of the left V4 segment. Dominant right vertebral artery widely patent. Critical Value/emergent results were called by telephone at the time of interpretation on 02/19/2015 at 4:56 am to Dr. Dorian Pod , who verbally acknowledged these results. Electronically Signed   By: Jeannine Boga M.D.   On: 02/19/2015 05:50     STUDIES:    CULTURES: none  ANTIBIOTICS: none  SIGNIFICANT EVENTS: 11/30 code stroke 11/30 IR for MCA thrombus  LINES/TUBES: 11/30 OTT>> 11/30 left fem a line>>  DISCUSSION: 59 yo wf , former smoker, recently moved to [place], hx of stroke with some residual rt side weakness. Boyfriend Noted abrupt onset of rt side weakness, left gaze preference and code stroke was activated. MCA clot retrieval per IR 0700 11/30. Sedated on vent. Very little information available for PMH/  Sedated and with NMB on board. PCCM asked to manage vent. Note some question of vaginal bleeding, ?  From femoral artery cannulation. We will monitor  closely in ICU.  ASSESSMENT / PLAN:  PULMONARY A: VDRF post clot retrieval 11/30 0700 for MCA clot HX of tobacco abuse  P:   Wean vent once HD stable and NMB has worn off  CARDIOVASCULAR A:  Hx of HTN Goal of sbp <140 per neuro P:  BB as prn May need Cardene drip once sedation decreased  RENAL A:   No acute issue P:   Monitor creatine post dye load for IR procedure  GASTROINTESTINAL A:   GI protection P:   PPI  HEMATOLOGIC A:   Post stroke and clot retrieval MCA clot Vaginal bleeding ??? In presumed post menopausal female. P:  Activase per neuro Monitor for bleeding and hgb reduction Clean vaginal area and observe for further bleeding(? From femoral cannulation) if further vaginal bleeding then will need vaginal exam and possible GYN consult No further anticoagulants for now  INFECTIOUS A:   No known infections P:     ENDOCRINE A:   No acute issues  P:   Follw glucose for completeness  NEUROLOGIC A:   Code stroke 11/30 5 am with rt hemiparesis and left gaze preference. Hx of ischemic stroke 9/15 and residual right side weakness. IR 11/30 with clot retrieval MCA 11/30 7 a, intubated and to be extubated when awake P:   RASS goal: 1 Currently sedated/intubated with diprivan drip Neuro eval once sedation stopped and hopefully extubated   FAMILY  - Updates: No family at bedside  - Inter-disciplinary family meet or Palliative Care meeting due by:  day 7  Attending Note:  59 year old female with PMH of previous CVA and HTN who presents to [facility] with right sided weakness, taken to IR and intubated for intervention.  IR performed clot retrieval and transferred to the ICU for further care.  Patient is currently sedated and paralyzed on exam lungs are clear.  Target SBP of 120-140 mmHg and will allow to wake up but no extubation until sheaths are out.  Maintain intubated for now.  PCCM will follow for vent management and will start cardene for BP  control.  The patient is critically ill with multiple organ systems failure and requires high complexity decision making for assessment and support, frequent evaluation and titration of therapies, application of advanced monitoring technologies and extensive interpretation of multiple databases.   Critical Care Time devoted to patient care services described in this note is  35  Minutes. This time reflects time of care of this signee Dr Jennet Maduro. This critical care time does not reflect procedure time, or teaching time or supervisory time of PA/NP/Med student/Med Resident etc but could involve care discussion time.  Rush Farmer, M.D. [facility] Pulmonary/Critical Care Medicine. Pager: [phone]. After hours pager: [phone].

## 2015-02-19 NOTE — Progress Notes (Signed)
RN called RT to patient room due to patient having a decrease in sats to mid 80s.  Increased FIO2 to 70% and performed recruitment maneuver.  Sats improved to 95%.  Will continue to monitor.

## 2015-02-19 NOTE — Progress Notes (Signed)
STROKE TEAM PROGRESS NOTE   HISTORY Erin Galloway is an 59 y.o. female with a past medical history significant for HTN, ischemic stroke September 2016 with mild residual right sided weakness, brought to the ED by EMS due to acute onset of right hemiparesis, aphasia, left gaze preference. Husband said that they were in bed talking when suddenly she became confused, unable to speak and shortly thereafter couldn't move the right side and was constantly looking to the left .EMS was summoned and patient brought to the ED where she was noted to have NIHSS 19. CT brain showed no acute abnormality but remote infarction in the posterior left frontal lobe, rightparieto-occipital region and possibly a smaller area of remote infarction in the left occipital lobe. CTA brain with thrombus M1 segment left MCA and very poor collaterals. It is worth mentioning that patient was reportedly admitted to a local hospital in [place], [place] state with a similar presentation and by patient report she was treated with Lovenox and informed that the cause for that stroke was " a growth in her heart". In the ED, patient was awake, with global aphasia expressive>receptive. She was LKW 3 am  02/19/15. IV tPA was given, then sent to IR where she received TICI3 revascularization of the L M1 with trevo and 2.5 mg IA tPA.  She was admitted to the medical ICU for further evaluation and treatment.   SUBJECTIVE (INTERVAL HISTORY) Her boyfriend and RN are at the bedside.  RN concerned about blue fingers/toes and heavy vaginal bleeding. cardene started for BP control. History confirmed from patient's boyfriend. 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 Initially she was anticoagulated with Lovenox for 1 week subsequently was changed to aspirin. Cardiac surgery was discussed with patient refused surgery. Patient recovered quite well  but had mild residual right-sided weakness.   OBJECTIVE Temp:  [98.2 F (36.8 C)-99 F (37.2 C)] 98.2 F (36.8 C) (11/30 0550) Pulse Rate:  [84-94] 88 (11/30 0550) Cardiac Rhythm:  [-]  Resp:  [12-19] 19 (11/30 0550) BP: (160-186)/(87-93) 170/92 mmHg (11/30 0550) SpO2:  [93 %-100 %] 100 % (11/30 0550) FiO2 (%):  [40 %] 40 % (11/30 0900) Weight:  [63.05 kg (139 lb)-63.504 kg (140 lb)] 63.05 kg (139 lb) (11/30 0505)  CBC:   Recent Labs Lab 02/19/15 0404 02/19/15 0414  WBC 10.2  --   NEUTROABS 5.0  --   HGB 13.4 14.6  HCT 40.3 43.0  MCV 82.6  --   PLT 178  --     Basic Metabolic Panel:   Recent Labs Lab 02/19/15 0404 02/19/15 0414  NA 139 140  K 3.7 3.7  CL 101 100*  CO2 28  --   GLUCOSE 121* 122*  BUN 10 13  CREATININE 0.96 1.00  CALCIUM 10.2  --     Lipid Panel: No results found for: CHOL, TRIG, HDL, CHOLHDL, VLDL, LDLCALC HgbA1c: No results found for: HGBA1C Urine Drug Screen: No results found for: LABOPIA, COCAINSCRNUR, LABBENZ, AMPHETMU, THCU, LABBARB    IMAGING  Ct Head Wo Contrast  02/19/2015  0907  1. Hemorrhagic conversion in the left occipital lobe infarcts with two hematomas up to 22 mm diameter. 2. Multi focal bilateral cerebral convexity high density is likely enhancement from subacute infarct. Pattern suggests recurrent central embolic disease.  02/19/2015  0428 Remote infarctions with encephalomalacia. Generalized atrophy and chronic white matter changes consistent  with small vessel disease. No acute intracranial findings.   CTA NECK   02/19/2015  1. Atheromatous plaque about the left carotid bifurcation/proximal left ICA with associated short segment stenosis of approximately 40-50% by NASCET criteria. 2. Mild atheromatous plaque about the right carotid bifurcation/proximal right ICA without hemodynamically significant stenosis. 3. 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 The  dominant right vertebral artery is widely patent along its entire course.   CTA HEAD   02/19/2015  1. Short segment focal defect within the mid left M1 segment, worrisome for nonocclusive thrombus. There may be a superimposed stenosis at this level as well. Flow is present within the left M1 segment distally as well as the proximal M2 branches, however, the MCA branches are markedly attenuated and possibly occluded distally. 2. Atheromatous disease within the cavernous ICAs with associated mild to moderate multi focal irregularity and stenosis. 3. Irregularity with multi focal moderate to severe narrowing of the left V4 segment. Dominant right vertebral artery widely patent.   Cerebral Angiogram   02/19/2015  S/P Lt common carotid arteriogram,followed by complete revascularization of occluded LT LMCA M 1 With x1 pass with 86mm x 30 mm Trevoprovue retrieval device and 2.5 mg of superselective intracranial TPA  TICI 3 flow restored  PHYSICAL EXAM Middle aged caucasian lady intubated and sedated.Right arterial groin sheath. . Afebrile. Head is nontraumatic. Neck is supple without bruit.    Cardiac exam no murmur or gallop. Lungs are clear to auscultation. Distal pulses are  Not well felt.She has acrocyanosis over several toes and fingers bilaterally( boyfriend confirms this has happened before on exposure to cold) Neurological Exam : Limited as patient is on propofol. Eyes open partially. Patient is aphasic and will not follow any commands Left gaze preference but has spontaneous eye movements of the right. Pupils 3 mm equal reactive. Corneal reflexes are present. Fundi were not visualized. Does not blink to threat on either side. Right lower facial weakness. Tongue midline. Spontaneous antigravity left sided movements. No spontaneous right-sided movement but will withdraw to pain in right upper and lower extremity.  ASSESSMENT/PLAN Ms. Erin Galloway is a 58 y.o. female with history of HTN and a  stroke 6 weeks ago due to MV abnormality for which she refused surgery presenting with right hemiparesis, aphasia and L gaze preference. She received  IV t-PA 02/19/2015 at 0436 and complete revascularization of L M1 with trevo and IA tPA.   Stroke:  Dominant left MCA infarct s/p IV tPA and TICI3 revascularizaion of L M1 with mechanical embolectomy and IA tPA. Stroke in setting of L anterior frontal infarct 11/2014 with likely unrealized R parietal infarct since that admission. Current infarct(s) felt to be embolic secondary to cardiac mechanism. Pt with post tPA hemorrhage R parietal Stroke workup underway.   Resultant  Respiratory failure, global aphasia, right hemiparesis and L gaze deviation  On diprivan  subacute R parietal hemorrhagic infarct that remains hyperintense on initial CT   Post IR CT with L occipital hemorrhage. Unclear if post tPA hemorrhage is symptomatic at this time  MRI  Pending. Ok to hold until 12/1  MRA  Canceled  repeat CT head this afternoon to follow up hemorrhage  Repeat CT Head in am  2D Echo  Change to stat given unknown MV issue as well as ecchymotic fingers and toes  Carotid canceled  LDL pending   HgbA1c pending  SCDs for VTE prophylaxis Diet NPO time specified  No  antithrombotic prior to admission, now on No antithrombotic as within 24h of tPA  Ongoing aggressive stroke risk factor management  Per boyfriend, pt had a stroke 6 weeks ago in [place] state thought to be related to a growth on her MV for which the doctors recommended surgery, which pt refused. Was treated with lovenox x 1 week, then stopped.  Therapy recommendations:  pending   Disposition:  pending (pt has a mother, daughter and son who are all estranged per the boyfriend. He states he is "all she is got". Dr. Leonie Man discussed diagnosis, prognosis,  treatment options and plan of care with him. He understands she is gravely ill and at risk for neurologic worsening and death.  He  will try to contact her family.)  Acute respiratory failure  Intubated for neuro intervention  CCM managing vent  Possible Raynaud's vs ischemic event  Bilateral tips of hands and feet ecchymotic  Boyfriend reports this was present during previous hospitalization and MD's were not concerned  Stat echo to look for possible cardiac source  Vaginal Bleeding  In presumed post menopausal female  Check labs in am  Consider   Hypertension  BP 160/87 on arrival. Up to 186/73 post procedure  Started on cardene drip  Current BP goal 120-140 per Dr. Estanislado Pandy  Hyperlipidemia  Home meds:  lipitor 20  LDL pending , goal < 70  Resume statin once able to swallow or has a feeding tube  Plan continue statin at discharge  Other Stroke Risk Factors  Hx stroke/TIA  Other Active Problems  Vaginal bleeding. Check labs in am.  Hospital day # 0  Radene Journey [facility] for Pager information 02/19/2015 12:41 PM  I have personally examined this patient, reviewed notes, independently viewed imaging studies, participated in medical decision making and plan of care. I have made any additions or clarifications directly to the above note. Agree with note above. Patient presented with a left MCA infarct due to left M1 occlusion and underwent treatment with IV TPA followed by mechanical embolectomy. She apparently had a previous stroke 6 weeks ago in [place] and at that time of mitral valve lesion was found and patient was briefly placed on Lovenox and open heart surgery was discussed with patient refused. Strong suspicion for cardiogenic embolism from either left atrial clot or myxoma. Patient's condition is critical and she is at significant risk for neurological worsening and recurrent strokes. Postprocedure CT scan raises concern for post TPA hemorrhage unfortunately there is no antidote available and no alternative to conservative therapy. I had a long  discussion of the bedside with the patient's boyfriend but was unable to reach her family. Plan close neurological monitoring and strict blood pressure control. Repeat CT scan in the morning and check transthoracic echo and if unyielding considered doing TEE in the morning This patient is critically ill and at significant risk of neurological worsening, death and care requires constant monitoring of vital signs, hemodynamics,respiratory and cardiac monitoring, extensive review of multiple databases, frequent neurological assessment, discussion with family, other specialists and medical decision making of high complexity.I have made any additions or clarifications directly to the above note.This critical care time does not reflect procedure time, or teaching time or supervisory time of PA/NP/Med Resident etc but could involve care discussion time.  I spent 60 minutes of neurocritical care time  in the care of  this patient.     Antony Contras, MD Medical Director [facility] Pager:  [phone] 02/19/2015 1:02 PM    To contact Stroke Continuity provider, please refer to [email or web address] After hours, contact General Neurology

## 2015-02-19 NOTE — Anesthesia Preprocedure Evaluation (Signed)
Anesthesia Evaluation  Patient identified by MRN, date of birth, ID band Patient confused    Reviewed: Unable to perform ROS - Chart review onlyPreop documentation limited or incomplete due to emergent nature of procedure.  Airway    Neck ROM: Full    Dental  (+) Teeth Intact   Pulmonary former smoker,    breath sounds clear to auscultation       Cardiovascular hypertension, Pt. on medications  Rhythm:Regular Rate:Normal     Neuro/Psych CVA, Residual Symptoms negative psych ROS   GI/Hepatic negative GI ROS, Neg liver ROS,   Endo/Other  negative endocrine ROS  Renal/GU negative Renal ROS  negative genitourinary   Musculoskeletal negative musculoskeletal ROS (+)   Abdominal   Peds negative pediatric ROS (+)  Hematology negative hematology ROS (+)   Anesthesia Other Findings   Reproductive/Obstetrics negative OB ROS                             EKG: normal sinus rhythm.  Anesthesia Physical Anesthesia Plan  ASA: III and emergent  Anesthesia Plan: General   Post-op Pain Management:    Induction: Intravenous, Rapid sequence and Cricoid pressure planned  Airway Management Planned: Oral ETT  Additional Equipment:   Intra-op Plan:   Post-operative Plan: Post-operative intubation/ventilation  Informed Consent: I have reviewed the patients History and Physical, chart, labs and discussed the procedure including the risks, benefits and alternatives for the proposed anesthesia with the patient or authorized representative who has indicated his/her understanding and acceptance.   Dental advisory given  Plan Discussed with: CRNA  Anesthesia Plan Comments:         Anesthesia Quick Evaluation

## 2015-02-19 NOTE — Progress Notes (Signed)
RN called RT to patient room due to patient having decrease in sats.  Performed recruitment maneuver with an improvement in sats, however once patient placed back on full support ventilation, patient sats dropped.  Increased FIO2 to 100% and per MD increased PEEP.  Will continue to monitor.

## 2015-02-19 NOTE — Progress Notes (Signed)
TPA had been administered in ED at 0436. Dr. Estanislado Pandy requested a foley catheter to be placed after TPA started. Dr. Estanislado Pandy discontinued request after pt noted to have vaginal bleeding, significant other states "she does not have menstrual cycles anymore."

## 2015-02-19 NOTE — Progress Notes (Signed)
Bilateral toes cyanotic with left toes darker and left foot  cooler however right foot is mottled continue to doppler bilateral dorsalis pedis and bilateral posterior tibial pulses .Bilateral fingertips also dusky with thumb and index finger on left hand dark purple. Dr Leonie Man and Lennette Bihari PA for interventional radiology also informed after calling Interventional radiology

## 2015-02-19 NOTE — Progress Notes (Signed)
Interventional Radiology here at bedside to remove rt groin 40fr sheath and lt groin 48fr sheath.  Pt id'ed by name and birthdate.  Rt groin sheath closed with 25fr ExoSeal. Pressure held for 40min until hemostasis achieved.  Lt groin sheath removed and held until hemostasis achieved.  tegaderm and pressure dressing applied along with 10lb sandbags bilaterally. rn at bedside. jkc/bf

## 2015-02-19 NOTE — Progress Notes (Signed)
2D without MV abnormality. Have requested TEE tomorrow. NPO after MN. Discussed with RN.  [place] for Pager information 02/19/2015 1:06 PM

## 2015-02-19 NOTE — Progress Notes (Signed)
SLP Cancellation Note  Patient Details Name: Erin Galloway MRN: [id number] DOB: [birth year]   Cancelled treatment:       Reason Eval/Treat Not Completed: Patient not medically ready. Per chart, pt remains on ventilator. Will continue to follow for readiness.   Germain Osgood, M.A. CCC-SLP [phone]  Germain Osgood 02/19/2015, 2:09 PM

## 2015-02-19 NOTE — Anesthesia Procedure Notes (Signed)
Procedure Name: Intubation Date/Time: 02/19/2015 6:03 AM Performed by: Claris Che Pre-anesthesia Checklist: Patient identified, Emergency Drugs available, Suction available, Patient being monitored and Timeout performed Patient Re-evaluated:Patient Re-evaluated prior to inductionOxygen Delivery Method: Circle system utilized Preoxygenation: Pre-oxygenation with 100% oxygen Intubation Type: IV induction, Rapid sequence and Cricoid Pressure applied Ventilation: Mask ventilation without difficulty Laryngoscope Size: Mac and 3 Grade View: Grade III Tube type: Oral Tube size: 7.5 mm Airway Equipment and Method: Stylet Placement Confirmation: ETT inserted through vocal cords under direct vision,  positive ETCO2 and breath sounds checked- equal and bilateral Secured at: 23 cm Dental Injury: Teeth and Oropharynx as per pre-operative assessment

## 2015-02-19 NOTE — H&P (Signed)
Admission H&P    Chief Complaint: code stroke, right hemiparesis, aphasia, left gaze preference. HPI: Deneshia Zucker is an 59 y.o. female with a past medical history significant for HTN, ischemic stroke last September with mild residual right sided weakness, brought to the ED by EMS due to acute onset of the above stated symptoms. Husband said that they were in bed talking when suddenly she became confused, unable to speak and shortly thereafter couldn't move the right side and was constantly looking to the left .EMS was summoned and patient brought to the ED where she was noted to have NIHSS 19. CT brain was personalized reviewed and showed no acute abnormality but remote infarction in the posterior left frontal lobe, rightparieto-occipital region and possibly a smaller area of remote infarction in the left occipital lobe. CTA brain with thrombus M1 segment left MCA and very poor collaterals. It is worth mentioning that patient was reportedly admitted to a local hospital in [place], [place] state with a similar presentation and by patient report she was treated with Lovenox and informed that the cause for that stroke was " a growth in her heart". Currently, patient is awake, with global aphasia expressive>receptive.  LSN: 3 am, 02/19/15 tPA Given: yes   Past Medical History  Diagnosis Date  . Stroke [facility]) 2016    September   . Hypertension     Past Surgical History  Procedure Laterality Date  . Induced abortion      Two abortions     No family history on file. Social History:  reports that she has quit smoking. She has never used smokeless tobacco. She reports that she does not drink alcohol or use illicit drugs. Family history: unable to obtain due to mental status Allergies: No Known Allergies   (Not in a hospital admission)  ROS: unable to obtain due to mental status   Physical Examination: Blood pressure 163/88, pulse 85, temperature 98.3 F (36.8 C), resp. rate 16,  height 5' 4"  (1.626 m), weight 63.05 kg (139 lb), SpO2 100 %.  HEENT-  Normocephalic, no lesions, without obvious abnormality.  Normal external eye and conjunctiva.  Normal TM's bilaterally.  Normal auditory canals and external ears. Normal external nose, mucus membranes and septum.  Normal pharynx. Neck supple with no masses, nodes, nodules or enlargement. Cardiovascular - regular rate and rhythm, S1, S2 normal, no murmur, click, rub or gallop Lungs - chest clear, no wheezing, rales, normal symmetric air entry, Heart exam - S1, S2 normal, no murmur, no gallop, rate regular Abdomen - soft, non-tender; bowel sounds normal; no masses,  no organomegaly Extremities - no clubbing, purplish discoloration fingers both hands  Neurologic Examination: General: NAD Mental Status: Alert and awake, global aphasia expressive>receptive. Cranial Nerves: II: Discs flat bilaterally; Visual fields appear to b e impaired in the right, pupils equal, round, reactive to light III,IV, VI: ptosis not present, left gaze preference V,VII: smile asymmetric due to mild right lower face weakness, facial light touch sensation unreliable testing VIII: hearing normal bilaterally IX,X: uvula rises symmetrically XI: bilateral shoulder shrug no tested XII: midline tongue extension without atrophy or fasciculations Motor: Dense right hemiparesis Tone and bulk:normal tone throughout; no atrophy noted Sensory: reacts to pain upper and lower extremities Deep Tendon Reflexes:  2 all over Plantars: Right: upgoing   Left: downgoing Cerebellar: Unable to test in the right side due to weakness Gait:  Unable to test due to dense right hemiparesis.    Results for orders placed or performed during  the hospital encounter of 02/19/15 (from the past 48 hour(s))  Protime-INR     Status: None   Collection Time: 02/19/15  4:04 AM  Result Value Ref Range   Prothrombin Time 14.5 11.6 - 15.2 seconds   INR 1.11 0.00 - 1.49  APTT      Status: None   Collection Time: 02/19/15  4:04 AM  Result Value Ref Range   aPTT 25 24 - 37 seconds  CBC     Status: None   Collection Time: 02/19/15  4:04 AM  Result Value Ref Range   WBC 10.2 4.0 - 10.5 K/uL   RBC 4.88 3.87 - 5.11 MIL/uL   Hemoglobin 13.4 12.0 - 15.0 g/dL   HCT 40.3 36.0 - 46.0 %   MCV 82.6 78.0 - 100.0 fL   MCH 27.5 26.0 - 34.0 pg   MCHC 33.3 30.0 - 36.0 g/dL   RDW 13.7 11.5 - 15.5 %   Platelets 178 150 - 400 K/uL  Differential     Status: Abnormal   Collection Time: 02/19/15  4:04 AM  Result Value Ref Range   Neutrophils Relative % 48 %   Neutro Abs 5.0 1.7 - 7.7 K/uL   Lymphocytes Relative 41 %   Lymphs Abs 4.2 (H) 0.7 - 4.0 K/uL   Monocytes Relative 6 %   Monocytes Absolute 0.6 0.1 - 1.0 K/uL   Eosinophils Relative 4 %   Eosinophils Absolute 0.4 0.0 - 0.7 K/uL   Basophils Relative 1 %   Basophils Absolute 0.1 0.0 - 0.1 K/uL  Comprehensive metabolic panel     Status: Abnormal   Collection Time: 02/19/15  4:04 AM  Result Value Ref Range   Sodium 139 135 - 145 mmol/L   Potassium 3.7 3.5 - 5.1 mmol/L   Chloride 101 101 - 111 mmol/L   CO2 28 22 - 32 mmol/L   Glucose, Bld 121 (H) 65 - 99 mg/dL   BUN 10 6 - 20 mg/dL   Creatinine, Ser 0.96 0.44 - 1.00 mg/dL   Calcium 10.2 8.9 - 10.3 mg/dL   Total Protein 7.5 6.5 - 8.1 g/dL   Albumin 3.9 3.5 - 5.0 g/dL   AST 23 15 - 41 U/L   ALT 16 14 - 54 U/L   Alkaline Phosphatase 100 38 - 126 U/L   Total Bilirubin 0.4 0.3 - 1.2 mg/dL   GFR calc non Af Amer >60 >60 mL/min   GFR calc Af Amer >60 >60 mL/min    Comment: (NOTE) The eGFR has been calculated using the CKD EPI equation. This calculation has not been validated in all clinical situations. eGFR's persistently <60 mL/min signify possible Chronic Kidney Disease.    Anion gap 10 5 - 15  I-stat troponin, ED (not at [facility])     Status: Abnormal   Collection Time: 02/19/15  4:11 AM  Result Value Ref Range   Troponin i, poc 0.52 (HH) 0.00 - 0.08 ng/mL    Comment NOTIFIED PHYSICIAN    Comment 3            Comment: Due to the release kinetics of cTnI, a negative result within the first hours of the onset of symptoms does not rule out myocardial infarction with certainty. If myocardial infarction is still suspected, repeat the test at appropriate intervals.   I-Stat Chem 8, ED  (not at [facility], [facility])     Status: Abnormal   Collection Time: 02/19/15  4:14 AM  Result Value Ref  Range   Sodium 140 135 - 145 mmol/L   Potassium 3.7 3.5 - 5.1 mmol/L   Chloride 100 (L) 101 - 111 mmol/L   BUN 13 6 - 20 mg/dL   Creatinine, Ser 1.00 0.44 - 1.00 mg/dL   Glucose, Bld 122 (H) 65 - 99 mg/dL   Calcium, Ion 1.23 1.12 - 1.23 mmol/L   TCO2 28 0 - 100 mmol/L   Hemoglobin 14.6 12.0 - 15.0 g/dL   HCT 43.0 36.0 - 46.0 %  CBG monitoring, ED     Status: Abnormal   Collection Time: 02/19/15  4:51 AM  Result Value Ref Range   Glucose-Capillary 112 (H) 65 - 99 mg/dL   Ct Head Wo Contrast  02/19/2015  CLINICAL DATA:  Right-sided weakness, last seen normal at 0 3:00 EXAM: CT HEAD WITHOUT CONTRAST TECHNIQUE: Contiguous axial images were obtained from the base of the skull through the vertex without intravenous contrast. COMPARISON:  None. FINDINGS: There is no intracranial hemorrhage or extra-axial fluid collection. There is encephalomalacia due to remote infarction in the posterior left frontal lobe. There is remote infarction in the right parieto-occipital region and possibly a smaller area of remote infarction in the left occipital lobe. No acute infarction is evident. There is mild generalized atrophy. There is white matter hypodensity suggesting chronic small vessel disease. No bone abnormality is evident. The visible paranasal sinuses are clear. IMPRESSION: Remote infarctions with encephalomalacia. Generalized atrophy and chronic white matter changes consistent with small vessel disease. No acute intracranial findings. Critical Value/emergent results were called by  telephone at the time of interpretation on 02/19/2015 at 4:25 am to Dr. Everlene Balls , who verbally acknowledged these results. Electronically Signed   By: Andreas Newport M.D.   On: 02/19/2015 04:28    Assessment: 59 y.o. female brought in with an acute left hemispheric syndrome and CTA brain revealing a thrombus in the left  MCA-M1 segment with very poor collateralization in that distribution. NIHSS 19, CT brain without acute abnormality. Inclusion and exclusion criteria were reviewed and patient was considered a suitable candidate for IV tPA + endovascular intervention. Husband was updated regarding patient critical condition. Interventional neuroradiologist informed and will proceed accordingly. Admit to the  NICU. Stroke team will follow up today.  Stroke Risk Factors - HTN, prior stroke  Plan: 1. HgbA1c, fasting lipid panel 2. MRI, MRA  of the brain without contrast 3. PT consult, OT consult, Speech consult 4. Echocardiogram 5. Carotid dopplers 6. Prophylactic therapy-aspirin as per protocol 7. Risk factor modification 7. Telemetry monitoring   Dorian Pod,  MD [facility] [phone]  02/19/2015, 5:13 AM

## 2015-02-19 NOTE — ED Notes (Signed)
Pt transported to IR with this RN and Government social research officer from [employer]

## 2015-02-19 NOTE — Procedures (Signed)
S/P Lt common carotid arteriogram,followed by complete revascularization of occluded LT LMCA M 1  With x1 pass with 68mm x 30 mm Trevoprovue retrieval device and 2.5 mg of superselective intracranial TPA  TICI 3 flow restored

## 2015-02-19 NOTE — Code Documentation (Signed)
Erin Galloway is a 59yo wf presenting to [facility] via [facility] for acute Rt side weakness and aphasia.  Per report the pt was in bed and talking with her husband at 0300 when she began to have a Lt gaze and Rt side weakness.  She was nonverbal for EMS and minimally using her Rt arm.  She has a hx of stroke 2-1mths in the past, but per her husband she recovered from it.  NIH 20 for Rt side weakness, aphasia, sensory deficit, and gaze palsy.  Ct and CTA scan completed and tPA given within 35 min of arrival to the [facility].

## 2015-02-19 NOTE — Progress Notes (Signed)
Patient transported to CT and back to room 2M04 without any complications.

## 2015-02-19 NOTE — ED Notes (Signed)
Per pts husband she had a stroke "2 and a half months ago" while she was getting "her lovenox she started having some bleeding problems" "She had a growth on her mitral valve and they think that's what caused her last stroke. She wasn't able to speak when she had the stroke, she slowly got better, she was slurring her words, but wasn't able to enunciate". He denies no major bleeding. No recent surgery, no recent trauma. Pt is not on blood thinners.

## 2015-02-20 ENCOUNTER — Inpatient Hospital Stay (HOSPITAL_COMMUNITY): Payer: Medicaid - Out of State

## 2015-02-20 ENCOUNTER — Encounter (HOSPITAL_COMMUNITY): Payer: Self-pay | Admitting: Interventional Radiology

## 2015-02-20 DIAGNOSIS — I610 Nontraumatic intracerebral hemorrhage in hemisphere, subcortical: Secondary | ICD-10-CM

## 2015-02-20 DIAGNOSIS — I340 Nonrheumatic mitral (valve) insufficiency: Secondary | ICD-10-CM

## 2015-02-20 LAB — GLUCOSE, CAPILLARY
Glucose-Capillary: 99 mg/dL (ref 65–99)
Glucose-Capillary: 106 mg/dL — ABNORMAL HIGH (ref 65–99)
Glucose-Capillary: 78 mg/dL (ref 65–99)

## 2015-02-20 LAB — LIPID PANEL
CHOLESTEROL: 216 mg/dL — AB (ref 0–200)
HDL: 27 mg/dL — ABNORMAL LOW (ref >40)
LDL Cholesterol: UNDETERMINED mg/dL (ref 0–99)
TRIGLYCERIDES: 450 mg/dL — AB (ref <150)
Total CHOL/HDL Ratio: 8 RATIO
VLDL: UNDETERMINED mg/dL (ref 0–40)

## 2015-02-20 LAB — CBC
HEMATOCRIT: 31 % — AB (ref 36.0–46.0)
HEMOGLOBIN: 10 g/dL — AB (ref 12.0–15.0)
MCH: 27 pg (ref 26.0–34.0)
MCHC: 32.3 g/dL (ref 30.0–36.0)
MCV: 83.8 fL (ref 78.0–100.0)
Platelets: 209 10*3/uL (ref 150–400)
RBC: 3.7 MIL/uL — ABNORMAL LOW (ref 3.87–5.11)
RDW: 14.1 % (ref 11.5–15.5)
WBC: 10.4 10*3/uL (ref 4.0–10.5)

## 2015-02-20 LAB — CBC WITH DIFFERENTIAL/PLATELET
BASOS PCT: 1 %
Basophils Absolute: 0.1 10*3/uL (ref 0.0–0.1)
Eosinophils Absolute: 0.4 10*3/uL (ref 0.0–0.7)
Eosinophils Relative: 3 %
HEMATOCRIT: 32.1 % — AB (ref 36.0–46.0)
HEMOGLOBIN: 10.6 g/dL — AB (ref 12.0–15.0)
LYMPHS ABS: 2.5 10*3/uL (ref 0.7–4.0)
Lymphocytes Relative: 20 %
MCH: 27.7 pg (ref 26.0–34.0)
MCHC: 33 g/dL (ref 30.0–36.0)
MCV: 84 fL (ref 78.0–100.0)
MONO ABS: 1.1 10*3/uL — AB (ref 0.1–1.0)
MONOS PCT: 9 %
NEUTROS ABS: 8.2 10*3/uL — AB (ref 1.7–7.7)
NEUTROS PCT: 67 %
Platelets: 213 10*3/uL (ref 150–400)
RBC: 3.82 MIL/uL — ABNORMAL LOW (ref 3.87–5.11)
RDW: 14.2 % (ref 11.5–15.5)
WBC: 12.3 10*3/uL — ABNORMAL HIGH (ref 4.0–10.5)

## 2015-02-20 LAB — MAGNESIUM: MAGNESIUM: 1.8 mg/dL (ref 1.7–2.4)

## 2015-02-20 LAB — BASIC METABOLIC PANEL
Anion gap: 9 (ref 5–15)
BUN: 6 mg/dL (ref 6–20)
CALCIUM: 9 mg/dL (ref 8.9–10.3)
CHLORIDE: 105 mmol/L (ref 101–111)
CO2: 26 mmol/L (ref 22–32)
CREATININE: 0.72 mg/dL (ref 0.44–1.00)
GFR calc non Af Amer: >60 mL/min (ref >60)
GLUCOSE: 132 mg/dL — AB (ref 65–99)
Potassium: 3.5 mmol/L (ref 3.5–5.1)
Sodium: 140 mmol/L (ref 135–145)

## 2015-02-20 LAB — PHOSPHORUS: Phosphorus: 4.8 mg/dL — ABNORMAL HIGH (ref 2.5–4.6)

## 2015-02-20 MED ORDER — FENTANYL CITRATE (PF) 100 MCG/2ML IJ SOLN
25.0000 ug | INTRAMUSCULAR | Status: DC | PRN
  Administered 2015-02-20: 50 ug via INTRAVENOUS
  Filled 2015-02-20: qty 2

## 2015-02-20 MED ORDER — POTASSIUM CHLORIDE 10 MEQ/100ML IV SOLN
10.0000 meq | INTRAVENOUS | Status: AC
  Administered 2015-02-20: 10 meq via INTRAVENOUS
  Filled 2015-02-20: qty 100

## 2015-02-20 MED ORDER — MAGNESIUM SULFATE 2 GM/50ML IV SOLN
2.0000 g | Freq: Once | INTRAVENOUS | Status: AC
  Administered 2015-02-20: 2 g via INTRAVENOUS
  Filled 2015-02-20: qty 50

## 2015-02-20 MED ORDER — CHLORHEXIDINE GLUCONATE 0.12% ORAL RINSE (MEDLINE KIT)
15.0000 mL | Freq: Two times a day (BID) | OROMUCOSAL | Status: DC
  Administered 2015-02-21 (×2): 15 mL via OROMUCOSAL

## 2015-02-20 MED ORDER — ANTISEPTIC ORAL RINSE SOLUTION (CORINZ)
7.0000 mL | OROMUCOSAL | Status: DC
  Administered 2015-02-21 (×8): 7 mL via OROMUCOSAL

## 2015-02-20 MED ORDER — ENOXAPARIN SODIUM 40 MG/0.4ML ~~LOC~~ SOLN
40.0000 mg | Freq: Every day | SUBCUTANEOUS | Status: DC
  Administered 2015-02-20 – 2015-02-25 (×6): 40 mg via SUBCUTANEOUS
  Filled 2015-02-20 (×6): qty 0.4

## 2015-02-20 MED ORDER — ASPIRIN 300 MG RE SUPP
300.0000 mg | Freq: Every day | RECTAL | Status: DC
  Administered 2015-02-20: 300 mg via RECTAL
  Filled 2015-02-20: qty 1

## 2015-02-20 MED ORDER — INSULIN ASPART 100 UNIT/ML ~~LOC~~ SOLN: 0.0000 [IU] | SUBCUTANEOUS | Status: DC

## 2015-02-20 MED ORDER — MIDAZOLAM HCL 2 MG/2ML IJ SOLN: 1.0000 mg | INTRAMUSCULAR | Status: DC | PRN

## 2015-02-20 NOTE — Anesthesia Postprocedure Evaluation (Signed)
Anesthesia Post Note  Patient: Erin Galloway  Procedure(s) Performed: Procedure(s) (LRB): RADIOLOGY WITH ANESTHESIA (N/A)  Patient location during evaluation: ICU Anesthesia Type: General Level of consciousness: awake and alert Pain management: pain level controlled Vital Signs Assessment: post-procedure vital signs reviewed and stable Respiratory status: patient on ventilator - see flowsheet for VS and patient remains intubated per anesthesia plan Cardiovascular status: blood pressure returned to baseline and stable Postop Assessment: no signs of nausea or vomiting Anesthetic complications: no    Last Vitals:  Filed Vitals:   02/20/15 1145 02/20/15 1154  BP: 126/68   Pulse: 97   Temp:  37.7 C  Resp: 14     Last Pain: There were no vitals filed for this visit.  LLE Motor Response: Purposeful movement   RLE Motor Response: Non-purposeful movement        Effie Berkshire

## 2015-02-20 NOTE — Progress Notes (Signed)
  Subjective: Pt s/p Lt common carotid arteriogram,followed by complete revascularization of occluded LT LMCA M 1 With x1 pass with 73mm x 30 mm Trevoprovue retrieval device and 2.5 mg of superselective intracranial TPA  Remains intubated, sedation was weaned down but pt become agitated and pulling at lines/tubes per RN Bilateral groin sheaths removed yesterday about 6pm  Objective: Physical Exam: BP 125/67 mmHg  Pulse 105  Temp(Src) 99.2 F (37.3 C) (Oral)  Resp 14  Ht 5\' 4"  (1.626 m)  Wt 139 lb (63.05 kg)  BMI 23.85 kg/m2  SpO2 100% Intubated, aphasic Does seem to follow some commands and moves right arm and leg. Bilateral groins soft, NT, no hematoma    Labs: CBC  Recent Labs  02/20/15 0330 02/20/15 0655  WBC 12.3* 10.4  HGB 10.6* 10.0*  HCT 32.1* 31.0*  PLT 213 209   BMET  Recent Labs  02/19/15 0404 02/19/15 0414 02/20/15 0330  NA 139 140 140  K 3.7 3.7 3.5  CL 101 100* 105  CO2 28  --  26  GLUCOSE 121* 122* 132*  BUN 10 13 6   CREATININE 0.96 1.00 0.72  CALCIUM 10.2  --  9.0   LFT  Recent Labs  02/19/15 0404  PROT 7.5  ALBUMIN 3.9  AST 23  ALT 16  ALKPHOS 100  BILITOT 0.4   PT/INR  Recent Labs  02/19/15 0404  LABPROT 14.5  INR 1.11    Assessment/Plan: (L)MCA CVA S/p Lt common carotid arteriogram,followed by complete revascularization of occluded LT LMCA M 1 With x1 pass with 26mm x 30 mm Trevoprovue retrieval device and 2.5 mg of superselective intracranial TPA  Care per Neuro Will report to Dr. Estanislado Pandy   LOS: 1 day    Ascencion Dike PA-C 02/20/2015 9:37 AM

## 2015-02-20 NOTE — Progress Notes (Signed)
OT Cancellation Note  Patient Details Name: Blakelynn Sekelsky MRN: [id number] DOB: 1955/08/22   Cancelled Treatment:    Reason Eval/Treat Not Completed: Other (comment) Pt with active bedrest orders. Please update activity orders when appropriate for therapy. Thanks.  Pearl, OTR/L  [id number] 02/20/2015 02/20/2015, 1:49 PM

## 2015-02-20 NOTE — H&P (Addendum)
    INTERVAL PROCEDURE H&P  History and Physical Interval Note:  02/20/2015 10:10 AM  A TEE has been requested by neurology to assess for source of embolic stroke. 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 Cardiac surgery was apparently recommended, but never performed. She has had a 2D echo here, which shows no evidence for mitral valve mass.  Erin Galloway has presented today for their planned procedure. The various methods of treatment have been discussed with the patient and family. As she is intubated, consent was obtained from her boyfriend. After consideration of risks, benefits and other options for treatment, the patient has consented to the procedure.  The patients' outpatient history has been reviewed, patient examined, and no change in status from most recent office note within the past 30 days. I have reviewed the patients' chart and labs and will proceed as planned. Questions were answered to the patient's satisfaction.   Pixie Casino, MD, [facility] Attending Cardiologist [place] [facility] 02/20/2015, 10:10 AM

## 2015-02-20 NOTE — Progress Notes (Signed)
[facility] ADULT ICU REPLACEMENT PROTOCOL FOR AM LAB REPLACEMENT ONLY  The patient does apply for the [facility] Adult ICU Electrolyte Replacment Protocol based on the criteria listed below:   1. Is GFR >/= 40 ml/min? Yes.    Patient's GFR today is >60 2. Is urine output >/= 0.5 ml/kg/hr for the last 6 hours? Yes.   Patient's UOP is 0.75 ml/kg/hr 3. Is BUN < 60 mg/dL? Yes.    Patient's BUN today is 6 4. Abnormal electrolyte  K 3.5 5. Ordered repletion with: per protocol 6. If a panic level lab has been reported, has the CCM MD in charge been notified? Yes.  .   Physician:  Dr Johnette Abraham Deterding  Christeen Douglas 02/20/2015 5:02 AM

## 2015-02-20 NOTE — Progress Notes (Signed)
PT Cancellation Note  Patient Details Name: Erin Galloway MRN: [id number] DOB: 12/13/1955   Cancelled Treatment:    Reason Eval/Treat Not Completed: Patient not medically ready Pt on strict bedrest. Will follow up once activity orders have been increased to perform PT evaluation.   Marguarite Arbour A Zi Newbury 02/20/2015, 7:44 AM  Wray Kearns, PT, DPT [phone]

## 2015-02-20 NOTE — CV Procedure (Signed)
TRANSESOPHAGEAL ECHOCARDIOGRAM (TEE) NOTE  INDICATIONS: stroke, previously identified mitral valve mass  PROCEDURE:   Informed consent was obtained prior to the procedure from the patient's mother via telephone. The risks, benefits and alternatives for the procedure were discussed and the patient comprehended these risks.  Risks include, but are not limited to, cough, sore throat, vomiting, nausea, somnolence, esophageal and stomach trauma or perforation, bleeding, low blood pressure, aspiration, pneumonia, infection, trauma to the teeth and death.    The patient is intubated and mechanically ventilated in the ICU. After a procedural time-out, the patient was bolused with Propfol and maintained on a propofol gtts by the ICU nursing staff.  The oropharynx was anesthetized with 2 cetacaine sprays.  The transesophageal probe was inserted in the esophagus and stomach without difficulty and multiple views were obtained.  The patient was kept under observation until the patient left the procedure room.  The patient left the procedure room in stable condition.   Agitated microbubble saline contrast was administered.  COMPLICATIONS:    There were no immediate complications.  Findings:  1. LEFT VENTRICLE: The left ventricular wall thickness is normal.  The left ventricular cavity is normal in size. Wall motion is normal.  LVEF is 55-60%.  2. RIGHT VENTRICLE:  The right ventricle is normal in structure and function without any thrombus or masses.    3. LEFT ATRIUM:  The left atrium is normal in size without any thrombus or masses.  There is not spontaneous echo contrast ("smoke") in the left atrium consistent with a low flow state.  4. LEFT ATRIAL APPENDAGE:  The left atrial appendage is free of any thrombus or masses. The appendage has single lobes. Pulse doppler indicates high flow in the appendage.  5. ATRIAL SEPTUM:  The atrial septum appears intact and is free of thrombus and/or masses.   There is no evidence for interatrial shunting by color doppler and saline microbubble.  6. RIGHT ATRIUM:  The right atrium is normal in size and function without any thrombus or masses.  7. MITRAL VALVE:  The mitral valve is notable for a shaggy, non-pedunculated round mass noted at the tip of the anterior mitral leaflet (it measures about 0.5 cm x 0.6 cm). This is on the atrial side of the valve and may prolapse across the valve plane. It was visualized in 2D and 3D views. There is Mild regurgitation.  There is no stenosis.  8. AORTIC VALVE:  The aortic valve is trileaflet, normal in structure and function with no regurgitation.  There were no vegetations or stenosis  9. TRICUSPID VALVE:  The tricuspid valve is normal in structure and function with trivial regurgitation.  There were no vegetations or stenosis  10.  PULMONIC VALVE:  The pulmonic valve is normal in structure and function with no regurgitation.  There were no vegetations or stenosis.   11. AORTIC ARCH, ASCENDING AND DESCENDING AORTA:  There was no Ron Parker et. Al, 1992) atherosclerosis of the ascending aorta, aortic arch, or proximal descending aorta.  12. PULMONARY VEINS: Anomalous pulmonary venous return was not noted.  13. PERICARDIUM: The pericardium appeared normal and non-thickened.  There is no pericardial effusion.  IMPRESSION:   1. Shaggy non-pedunculated 0.5 x 0.6 cm mass at the tip of the anterior mitral leaflet on the atrial side of the valve that minimally prolapses in the valve plane. There is associated mild regurgitation. Findings consistent with papillary fibroelastoma (although these are usually pedunculated and are more likely to  be on the ventricular side of the mitral valve). Also consider marantic (Libman-Sacks) endocarditis or less likely thrombus/vegetation. Compared to the echo report from her earlier TEE in [place], this represents a decrease in size of the mitral valve mass.  RECOMMENDATIONS:     1. Given recurrent stroke without a more likely etiology, cardiac surgery consultation is warranted for surgical removal. For completeness, would consider sending lupus anticoagulant and anti-phospholipid antibodies as well.  Time Spent Directly with the Patient:  45 minutes   Pixie Casino, MD, [facility] Attending Cardiologist [facility]  02/20/2015, 11:13 AM

## 2015-02-20 NOTE — Progress Notes (Signed)
STROKE TEAM PROGRESS NOTE   SUBJECTIVE (INTERVAL HISTORY) Patient with 2 composition books at the bedside, left by Ray. Appears to be writings pt has done since her previous stroke. Name of previous hospital found and contacted for stroke information. Reviewed in hospital records from [facility] in [place] state which suggests that patient was diagnosed with papillary fibroblastoma affecting the mitral valve. This was confirmed on both transthoracic and transesophageal echocardiograms. Cardiac thoracic surgery was consulted and hence surgery was recommended but patient refused.   OBJECTIVE Temp:  [99 F (37.2 C)-100 F (37.8 C)] 100 F (37.8 C) (12/01 1600) Pulse Rate:  [88-137] 114 (12/01 1600) Cardiac Rhythm:  [-] Sinus tachycardia (12/01 1600) Resp:  [14-23] 16 (12/01 1600) BP: (104-160)/(61-86) 146/84 mmHg (12/01 1600) SpO2:  [88 %-100 %] 100 % (12/01 1600) Arterial Line BP: (85-96)/(73-85) 85/80 mmHg (11/30 1700) FiO2 (%):  [35 %-60 %] 40 % (12/01 1631)  CBC:  Recent Labs Lab 02/19/15 0404  02/20/15 0330 02/20/15 0655  WBC 10.2  < > 12.3* 10.4  NEUTROABS 5.0  --  8.2*  --   HGB 13.4  < > 10.6* 10.0*  HCT 40.3  < > 32.1* 31.0*  MCV 82.6  < > 84.0 83.8  PLT 178  < > 213 209  < > = values in this interval not displayed.  Basic Metabolic Panel:  Recent Labs Lab 02/19/15 0404 02/19/15 0414 02/20/15 0330  NA 139 140 140  K 3.7 3.7 3.5  CL 101 100* 105  CO2 28  --  26  GLUCOSE 121* 122* 132*  BUN 10 13 6   CREATININE 0.96 1.00 0.72  CALCIUM 10.2  --  9.0  MG  --   --  1.8  PHOS  --   --  4.8*    Lipid Panel:    Component Value Date/Time   CHOL 216* 02/20/2015 0330   TRIG 450* 02/20/2015 0330   HDL 27* 02/20/2015 0330   CHOLHDL 8.0 02/20/2015 0330   VLDL UNABLE TO CALCULATE IF TRIGLYCERIDE OVER 400 mg/dL 02/20/2015 0330   LDLCALC UNABLE TO CALCULATE IF TRIGLYCERIDE OVER 400 mg/dL 02/20/2015 0330   HgbA1c: No results found for: HGBA1C Urine  Drug Screen: No results found for: LABOPIA, COCAINSCRNUR, LABBENZ, AMPHETMU, THCU, LABBARB    IMAGING  Ct Head Wo Contrast 02/20/2015 1. Evolving large MCA territory infarct and additional patchy right cerebral infarcts. Findings are better delineated on MRI of the brain performed on the same day. 2. Stable posterior left temporal and occipital hemorrhages. 3. Decreased conspicuity of small volume right parietal hemorrhage. 02/19/2015 1656 1. Stable appearance of posterior left temporal hemorrhages. 2. Evolving large left MCA territory infarct. 3. Densities in the right frontal and parietal lobes likely represents evolving infarcts with decreasing parenchymal enhancement. 4. Small right parietal hemorrhage is stable. 02/19/2015 0907 1. Hemorrhagic conversion in the left occipital lobe infarcts with two hematomas up to 22 mm diameter. 2. Multi focal bilateral cerebral convexity high density is likely enhancement from subacute infarct. Pattern suggests recurrent central embolic disease.  02/19/2015 0428 Remote infarctions with encephalomalacia. Generalized atrophy and chronic white matter changes consistent with small vessel disease. No acute intracranial findings.   CTA NECK  02/19/2015 1. Atheromatous plaque about the left carotid bifurcation/proximal left ICA with associated short segment stenosis of approximately 40-50% by NASCET criteria. 2. Mild atheromatous plaque about the right carotid bifurcation/proximal right ICA without hemodynamically significant stenosis. 3. Atheromatous disease at the origin of the left vertebral  artery with associated severe ostial stenosis. The dominant right vertebral artery is widely patent along its entire course.   CTA HEAD  02/19/2015 1. Short segment focal defect within the mid left M1 segment, worrisome for nonocclusive thrombus. There may be a superimposed stenosis at this level as well. Flow is present within the left M1 segment distally as well as the  proximal M2 branches, however, the MCA branches are markedly attenuated and possibly occluded distally. 2. Atheromatous disease within the cavernous ICAs with associated mild to moderate multi focal irregularity and stenosis. 3. Irregularity with multi focal moderate to severe narrowing of the left V4 segment. Dominant right vertebral artery widely patent.   Cerebral Angiogram 02/19/2015 S/P Lt common carotid arteriogram,followed by complete revascularization of occluded LT LMCA M 1 With x1 pass with 53mm x 30 mm Trevoprovue retrieval device and 2.5 mg of superselective intracranial TPA  TICI 3 flow restored  MRI head 02/20/2015 1. Evolving large confluent left MCA territory infarct, with additional patchy infarcts within the right frontal, parietal, and occipital regions, with additional small scattered bilateral cerebellar infarcts. 2. Stable hematomas within the left temporal and occipital lobes, consistent with hemorrhagic transformation. Small volume hemorrhage in the right parietal region also stable.  Chest Xray 02/20/2015 Patchy density peripherally in the left lower lung likely reflecting atelectasis or early pneumonia. The endotracheal tube is in reasonable position radiographically.  2D echo  - Left ventricle: The cavity size was normal. Wall thickness wasincreased in a pattern of mild LVH. Systolic function was normal.The estimated ejection fraction was in the range of 60% to 65%.Wall motion was normal; there were no regional wall motionabnormalities. Doppler parameters are consistent with abnormalleft ventricular relaxation (grade 1 diastolic dysfunction). - Mitral valve: There was mild regurgitation. - Pulmonary arteries: Systolic pressure was moderately increased.PA peak pressure: 44 mm Hg (S).  TEE 0.6 x 0.5 cm mitral valve mass on the tip of the anteriorleaflet, suggestive of papillary fibroelastoma - vegetation,thrombus or Libman-Sacks endocarditis are less likely  findings.    PHYSICAL EXAM Middle aged caucasian lady intubated and sedated. . . Afebrile. Head is nontraumatic. Neck is supple without bruit. Cardiac exam no murmur or gallop. Lungs are clear to auscultation. Distal pulses are not well felt.  Neurological Exam : Eyes open partially. Patient is aphasic and will not follow any commands Left gaze preference but has spontaneous eye movements of the left Pupils 3 mm equal reactive. Corneal reflexes are present. Fundi were not visualized. Does not blink to threat on either side. Right lower facial weakness. Tongue midline. Spontaneous antigravity left sided movements. No spontaneous right-sided movement but will withdraw to pain in right upper and lower extremity.    ASSESSMENT/PLAN Ms. Erin Galloway is a 59 y.o. female with history of HTN and a stroke 6 weeks ago due to MV abnormality for which she refused surgery presenting with right hemiparesis, aphasia and L gaze preference. She received IV t-PA 02/19/2015 at 0436 and complete revascularization of L M1 with trevo and IA tPA.   Stroke: large left MCA territory infarcts, patchy R frontal, parietal and occipital infarcts and bilateral cerebellar infarcts s/p IV tPA and TICI3 revascularizaion of L M1 with mechanical embolectomy and IA tPA. Stroke in setting of L anterior frontal infarct 11/2014 with likely unrealized R parietal infarct since that admission. 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 Post tPA hemorrhage R parietal and hemorrhagic transformation L temorporal and occipital  lobes.  Sheaths out this am  New tachycardia and decreased sats during the night.  Resultant Respiratory failure, global aphasia, right hemiparesis and L gaze deviation  subacute R parietal hemorrhagic infarct that remains hyperintense on initial CT   Post IR CT with L occipital hemorrhage. Unclear if post tPA hemorrhage is  symptomatic at this time  MRI left MCA territory infarcts, patchy R frontal, parietal and occipital infarcts and bilateral cerebellar infarcts   repeat CT head this am stable L hemorrhages, decreasing R parietal hemorrhage  2D Echo No SOE reported. Upon review with cardiology, MV abnormality present.   TEE today with 0.6x0.5 mitral valve mass on the anterior leaflet suggestive of papillary fibroelastoma  LDL unable to calculate  HgbA1c pending   SCDs for VTE prophylaxis. Added lovenox for VTE prophylaxis  Diet NPO time specified   No antithrombotic prior to admission, started on aspirin 300 mg suppository daily  Ongoing aggressive stroke risk factor management  Per boyfriend, pt had a stroke 6 weeks ago in [place] state thought to be related to a growth on her MV for which the doctors recommended surgery, which pt refused. Was treated with lovenox x 1 week, then stopped.  Called cardiothoracic surgery consult for surgical candidacy.  Therapy recommendations: pending   Disposition: pending (Dr. Leonie Man discussed diagnosis, prognosis,  treatment options and plan of care with mother via telephone yesterday. Mother has not spoken with pt since prior to first stroke in September. [place].)  Acute hypoxic respiratory failure  Intubated for neuro intervention  CCM managing vent  Peripheral cyanosis  Possible Raynaud's vs ischemic event  Bilateral tips of hands and feet ecchymotic  Boyfriend reports this was present during previous hospitalization and MD's were not concerned  Labs pending   Vaginal Bleeding  In presumed post menopausal female  Hgb 11.7 -> 10.0  Follow Hbg  Hypertension  BP 160/87 on arrival. Up to 186/73 post procedure  Started on cardene drip  115/63-160/83 today  Hyperlipidemia  Home meds: lipitor 20  LDL unable to calculate , goal < 70  Resume statin once able to swallow or has a feeding tube  Plan continue statin  at discharge  Other Stroke Risk Factors  Hx stroke/TIA  Hospital day # [place] for Pager information 02/20/2015 4:46 PM  I have personally examined this patient, reviewed notes, independently viewed imaging studies, participated in medical decision making and plan of care. I have made any additions or clarifications directly to the above note. Agree with note above. No family at the bedside. Boyfriend also not present. Plan repeat CT scan in the morning and mediastinum extubation based on that and patient's ability to. Discuss with Dr. Nelda Marseille. This patient is critically ill and at significant risk of neurological worsening, death and care requires constant monitoring of vital signs, hemodynamics,respiratory and cardiac monitoring, extensive review of multiple databases, frequent neurological assessment, discussion with family, other specialists and medical decision making of high complexity.I have made any additions or clarifications directly to the above note.This critical care time does not reflect procedure time, or teaching time or supervisory time of PA/NP/Med Resident etc but could involve care discussion time.  I spent 30 minutes of neurocritical care time  in the care of  this patient.      Antony Contras, MD Medical Director [facility] Stroke Center Pager: [phone] 02/20/2015 8:14 PM    To contact Stroke Continuity provider, please refer to  [email or web address] After hours, contact General Neurology

## 2015-02-20 NOTE — Progress Notes (Signed)
Patient transported from 2M04 to room 3M07 without any complications.

## 2015-02-20 NOTE — Progress Notes (Signed)
Echocardiogram Echocardiogram Transesophageal has been performed.  Joelene Millin 02/20/2015, 11:23 AM

## 2015-02-20 NOTE — Consult Note (Addendum)
PULMONARY / CRITICAL CARE MEDICINE   Name: Erin Galloway MRN: [id number] DOB: 1955/09/19    ADMISSION DATE:  02/19/2015 CONSULTATION DATE:  02/19/2015  REFERRING MD: IR  CHIEF COMPLAINT:  Rt side weakness, code stroke  INITIAL PRESENTATION:  59 yo wf , former smoker, recently moved to [place], hx of stroke with some residual rt side weakness. Boyfriend Noted abrupt onset of rt side weakness, left gaze preference and code stroke was activated. MCA clot retrieval per IR 0700 11/30. Sedated on vent. Very little information available for PMH/  Sedated and with NMB on board. PCCM asked to manage vent. Note some question of vaginal bleeding, ? From femoral artery cannulation. We will monitor closely in ICU.  STUDIES:  TTE 11/30:  EF 60-65%. Normal wall motion. Grade 1 diastolic function. PASP 46mmHg. RV normal in size & function. No mass seen. MRI Brain 12/1: Evolving L MCA CVA w/ patchy infarcts right frontal, parietal, & occipital. Scattered bilateral cerebella infarcts. Stable hematomas left temporal & occipital w/ small hemorrhage right parietal stable. TEE:  Pending  CULTURES: None  ANTIBIOTICS: None  SIGNIFICANT EVENTS: 11/30 code stroke 11/30 IR for MCA thrombus  LINES/TUBES: 11/30 OETT>> 11/30 left fem a line>> 11/30 Foley>>  SUBJECTIVE:  No acute events overnight. Remains on ventilator awaiting TEE.  REVIEW OF SYSTEMS:  Unable to obtain secondary to intubation & sedation.  VITAL SIGNS: BP 127/70 mmHg  Pulse 98  Temp(Src) 99.8 F (37.7 C) (Oral)  Resp 14  Ht 5\' 4"  (1.626 m)  Wt 139 lb (63.05 kg)  BMI 23.85 kg/m2  SpO2 100%  HEMODYNAMICS:    VENTILATOR SETTINGS: Vent Mode:  [-] PRVC FiO2 (%):  [35 %-100 %] 35 % Set Rate:  [14 bmp] 14 bmp Vt Set:  [440 mL] 440 mL PEEP:  [10 cmH20-14 cmH20] 10 cmH20 Plateau Pressure:  [19 cmH20-26 cmH20] 19 cmH20  INTAKE / OUTPUT: I/O last 3 completed shifts: In: 4054.7 [I.V.:3904.7; IV Piggyback:150] Out: [id number]  [Urine:1645]  PHYSICAL EXAMINATION: General:  Awake. No acute distress. No family at bedside.  Integument:  Warm & dry. No rash on exposed skin. Cyanosis of digit tips. HEENT:   No scleral injection or icterus. Endotracheal tube in place. PERRL. Cardiovascular:  Regular rate. No edema. No appreciable JVD.  Pulmonary:  Good aeration & clear to auscultation bilaterally. Symmetric chest wall rise on ventilator. Abdomen: Soft. Normal bowel sounds. Nondistended. . Neurological: Eyes open. Not following commands. Spontaneously moves left upper & lower extremities.  LABS:  CBC  Recent Labs Lab 02/19/15 2245 02/20/15 0330 02/20/15 0655  WBC 11.7* 12.3* 10.4  HGB 10.5* 10.6* 10.0*  HCT 32.0* 32.1* 31.0*  PLT 179 213 209   Coag's  Recent Labs Lab 02/19/15 0404  APTT 25  INR 1.11   BMET  Recent Labs Lab 02/19/15 0404 02/19/15 0414 02/20/15 0330  NA 139 140 140  K 3.7 3.7 3.5  CL 101 100* 105  CO2 28  --  26  BUN 10 13 6   CREATININE 0.96 1.00 0.72  GLUCOSE 121* 122* 132*   Electrolytes  Recent Labs Lab 02/19/15 0404 02/20/15 0330  CALCIUM 10.2 9.0  MG  --  1.8  PHOS  --  4.8*   Sepsis Markers No results for input(s): LATICACIDVEN, PROCALCITON, O2SATVEN in the last 168 hours. ABG  Recent Labs Lab 02/19/15 0858 02/19/15 1647  PHART 7.492* 7.423  PCO2ART 31.4* 41.1  PO2ART 143.0* 352.0*   Liver Enzymes  Recent Labs Lab 02/19/15 0404  AST 23  ALT 16  ALKPHOS 100  BILITOT 0.4  ALBUMIN 3.9   Cardiac Enzymes No results for input(s): TROPONINI, PROBNP in the last 168 hours. Glucose  Recent Labs Lab 02/19/15 0451  GLUCAP 112*    Imaging Ct Head Wo Contrast  02/20/2015  CLINICAL DATA:  Follow-up examination for stroke, status post catheter directed thrombolytic therapy. EXAM: CT HEAD WITHOUT CONTRAST TECHNIQUE: Contiguous axial images were obtained from the base of the skull through the vertex without intravenous contrast. COMPARISON:  Prior  study from 02/19/2015. FINDINGS: Large confluent evolving left MCA territory infarct, better evaluated on brain MRI performed on the same day. There is involvement of the left basal ganglia including the caudate and lentiform nuclei. Evidence of hemorrhagic transformation again seen, with the left occipital hematoma measuring 21 mm, and the posterior left temporal hematoma measuring 15 mm. Overall, these are similar relative to previous study. No significant mass effect or evidence of midline shift. Superimposed remote infarct within the left frontal lobe again noted. Additional patchy evolving infarcts involving the right parietal and occipital regions again noted. Probable associated faint hemorrhage within the right parietal region (series 201, image 16). This is slightly less prominent as compared to previous exam. Known tiny cerebellar infarcts not well visualized. No hydrocephalus.  No extra-axial fluid collection. Scalp soft tissues within normal limits. No acute abnormality about the orbits. Minimal opacity within the left sphenoid sinus. Paranasal sinuses are otherwise clear. No mastoid effusion. Calvarium unchanged. IMPRESSION: 1. Evolving large MCA territory infarct and additional patchy right cerebral infarcts. Findings are better delineated on MRI of the brain performed on the same day. 2. Stable posterior left temporal and occipital hemorrhages. 3. Decreased conspicuity of small volume right parietal hemorrhage. Electronically Signed   By: Jeannine Boga M.D.   On: 02/20/2015 06:32   Ct Head Wo Contrast  02/19/2015  CLINICAL DATA:  Stroke. Status post revascularization of the left middle cerebral artery. EXAM: CT HEAD WITHOUT CONTRAST TECHNIQUE: Contiguous axial images were obtained from the base of the skull through the vertex without intravenous contrast. COMPARISON:  CT head and CTA head from the same day. FINDINGS: A large left MCA territory infarct is now apparent with significant loss  of gray-white differential throughout the left MCA territory. The remote anterior left frontal lobe infarct is again seen. Focal parenchymal hemorrhages involving the posterior left temporal lobe are stable. Right parietal and frontal lobe infarcts are stable. Previously noted density is diminished suggesting this likely represents parenchymal enhancement rather than hemorrhage. The focus of right parietal hemorrhage is stable. Ventricles are of normal size. There is no significant midline shift. The paranasal sinuses and mastoid air cells are clear. The calvarium is intact. IMPRESSION: 1. Stable appearance of posterior left temporal hemorrhages. 2. Evolving large left MCA territory infarct. 3. Densities in the right frontal and parietal lobes likely represents evolving infarcts with decreasing parenchymal enhancement. 4. Small right parietal hemorrhage is stable. These results were called by telephone at the time of interpretation on 02/19/2015 at 4:55 pm to Dr. Luanne Bras AND PRAMOD SETHI, who verbally acknowledged these results. Electronically Signed   By: San Morelle M.D.   On: 02/19/2015 16:56   Mr Brain Wo Contrast  02/20/2015  CLINICAL DATA:  Initial evaluation for acute right-sided weakness, status post catheter directed thrombolytic therapy. EXAM: MRI HEAD WITHOUT CONTRAST TECHNIQUE: Multiplanar, multiecho pulse sequences of the brain and surrounding structures were obtained without intravenous contrast. COMPARISON:  Prior CTs from 02/19/2015. FINDINGS: Large  confluent left MCA territory infarct involving almost the entirety of the left MCA distribution including the left frontal, parietal, and temporal lobes is seen. There is involvement of the left basal ganglia including the caudate and putamen. Associated gyral swelling and edema throughout the area of infarction. Again, evidence of hemorrhagic transformation with a left occipital hematoma measuring 16 x 22 mm, and a posterior left  temporal hemorrhage measuring 11 x 15 mm. Fluid fluid level within the occipital hematoma. Superimposed remote left frontal infarct with associated encephalomalacia again noted. Additional patchy infarcts involving the cortical gray matter as well as the deep and subcortical white matter seen scattered throughout the right frontal, parietal, occipital, and posterior temporal lobes. Evidence of associated hemorrhage in the right parieto-occipital region (series 10, image 17, 14). Superimposed laminar necrosis likely present as well. Patchy subcentimeter acute ischemic infarcts within the bilateral cerebellar hemispheres without associated hemorrhage or mass effect. No brainstem infarct. Diminished flow void within the diminutive left vertebral artery. Major intracranial vascular flow voids otherwise maintained. No significant mass effect. No midline shift. No hydrocephalus. No extra-axial fluid collection. No mass lesion identified. Craniocervical junction within normal limits. Degenerative spondylolysis noted within the upper cervical spine without significant stenosis. 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 No acute abnormality about the orbits. Mild mucosal thickening within the ethmoidal air cells and maxillary sinuses. Trace opacity within the mastoid air cells. Inner ear structures normal. Bone marrow signal intensity within normal limits. Scalp soft tissues unremarkable. IMPRESSION: 1. Evolving large confluent left MCA territory infarct, with additional patchy infarcts within the right frontal, parietal, and occipital regions, with additional small scattered bilateral cerebellar infarcts. 2. Stable hematomas within the left temporal and occipital lobes, consistent with hemorrhagic transformation. Small volume hemorrhage in the right parietal region also stable. Electronically Signed   By: Jeannine Boga M.D.   On: 02/20/2015 06:20   Dg Chest Port 1 View  02/20/2015  CLINICAL DATA:  Stroke was  cerebral ischemia and respiratory failure EXAM: PORTABLE CHEST 1 VIEW COMPARISON:  None in PACs FINDINGS: The lungs are well-expanded. There is hazy density that projects peripherally in the left lower lung. There is no pleural effusion or pneumothorax. The heart and pulmonary vascularity are normal. The endotracheal tube tip lies 4.4 cm above the carina. IMPRESSION: Patchy density peripherally in the left lower lung likely reflecting atelectasis or early pneumonia. The endotracheal tube is in reasonable position radiographically. Electronically Signed   By: David  Martinique M.D.   On: 02/20/2015 08:03   ASSESSMENT / PLAN:  PULMONARY A: Acute Hypoxic Respiratory Failure H/O tobacco abuse   P:   Continuing vent support Weaning PEEP slowly by 2 every 4-6 hours  CARDIOVASCULAR A:  Possible Cardiac Mass H/O HTN Goal SBP per IR & Neurology Peripheral Cyanosis  P:  BP management by Neurology/IR Monitoring on telemetry Awaiting TEE result Checking autoimmune workup for Raynaud's  RENAL A:   Urinary retention  P:   Continue foley catheter Monitoring UOP with foley Trending renal function daily with BUN/Creatinine  GASTROINTESTINAL A:   GI protection  P:   Protonix IV daily Holding on tube feeds for now  HEMATOLOGIC A:   S/P Left MCA clot retrieval Questionable vaginal bleeding Anemia - Hgb stable. No further bleeding post tPA.  P:  Activase per neuro Trending Hgb daily with CBC SCDs Chemical DVT prophylaxis when ok with Neurology/IR  INFECTIOUS A:   No acute issue  P:   Monitor for leukocytosis & fever  ENDOCRINE A:   Hyperglycemia - No h/o DM.  P:   Checking Hgb A1c with AM labs Accu-Checks q4hr Low dose SSI algorithm  NEUROLOGIC A:   Left MCA CVA - Code stroke 11/30 @ 5am. S/P IR clot retrieval. 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  - Inter-disciplinary family meet or Palliative Care meeting due by:  12/7  TODAY'S SUMMARY:  59 year old female with known history of CVA. Now status post embolectomy by interventional radiology for acute left MCA CVA. Some hemorrhagic transformation bilaterally. Patient did have acute hypoxic respiratory failure which has responded to recruitment with PEEP. Starting weaning of PEEP while awaiting results of transesophageal echocardiogram for evaluation of possible cardiac mass. Checking ANA, anticentromere antibody, & SCL 70 given what appears to be Raynaud's on physical exam.  I have spent a total of 31 minutes of critical care time today caring for the patient and reviewing her electronic medical record.  Sonia Baller Ashok Cordia, M.D.  Pulmonary & Critical Care Pager:  [phone] After 3pm or if no response, call [phone]

## 2015-02-20 NOTE — Progress Notes (Signed)
[facility] BRIEF CONSULT NOTE   Patient seen, TEE, CT and MRI scans reviewed and case discussed with Dr. Leonie Man.  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  Full consult note to follow.  Rexene Alberts, MD 02/20/2015 5:04 PM

## 2015-02-20 NOTE — Progress Notes (Signed)
Report called to [place] and pt's significant other notified of patients room move

## 2015-02-20 NOTE — Progress Notes (Signed)
Initial Nutrition Assessment  DOCUMENTATION CODES:   Not applicable  INTERVENTION:    If unable to extubate patient, recommend place Cortrak feeding tube and initiate TF with Vital High Protein at goal rate of 30 ml/h with Prostat 30 ml BID to provide 920 kcals, 93 gm protein, 602 ml free water daily.  Total calorie intake with TF plus current calories from Propofol would be 1419 kcals (103% of estimated needs).  NUTRITION DIAGNOSIS:   Inadequate oral intake related to inability to eat as evidenced by NPO status.  GOAL:   Patient will meet greater than or equal to 90% of their needs  MONITOR:   Vent status, I & O's, Labs, Weight trends  REASON FOR ASSESSMENT:   Ventilator, Malnutrition Screening Tool    ASSESSMENT:   59 y.o. female with a past medical history significant for HTN, ischemic stroke September 2016 with mild residual right sided weakness, brought to the ED by EMS due to acute onset of right hemiparesis, aphasia, left gaze preference.   Labs reviewed: phosphorus elevated  Nutrition focused physical exam completed.  No muscle or subcutaneous fat depletion noticed. Patient without enteral access at this time. OGT was not placed due to TPA per discussion with RN. S/P TEE this AM. Remains on ventilator. Patient being transferred to Neuro ICU today.  Patient is currently intubated on ventilator support MV: 6.4 L/min Temp (24hrs), Avg:98.8 F (37.1 C), Min:97.6 F (36.4 C), Max:99.2 F (37.3 C)  Propofol: 18.9 ml/hr providing 499 kcals per day   Diet Order:  Diet NPO time specified  Skin:  Reviewed, no issues  Last BM:  11/29  Height:   Ht Readings from Last 1 Encounters:  02/19/15 5\' 4"  (1.626 m)    Weight:   Wt Readings from Last 1 Encounters:  02/19/15 139 lb (63.05 kg)    Ideal Body Weight:  54.5 kg  BMI:  Body mass index is 23.85 kg/(m^2).  Estimated Nutritional Needs:   Kcal:  [id number]  Protein:  85-100 gm  Fluid:  1.5 L  EDUCATION  NEEDS:   No education needs identified at this time  Molli Barrows, [place], [place], [place] Pager [phone] After Hours Pager [phone]

## 2015-02-21 ENCOUNTER — Inpatient Hospital Stay (HOSPITAL_COMMUNITY): Payer: Medicaid - Out of State

## 2015-02-21 DIAGNOSIS — I619 Nontraumatic intracerebral hemorrhage, unspecified: Secondary | ICD-10-CM

## 2015-02-21 DIAGNOSIS — G9340 Encephalopathy, unspecified: Secondary | ICD-10-CM | POA: Diagnosis present

## 2015-02-21 LAB — RENAL FUNCTION PANEL
Albumin: 3.1 g/dL — ABNORMAL LOW (ref 3.5–5.0)
Anion gap: 11 (ref 5–15)
BUN: 6 mg/dL (ref 6–20)
CHLORIDE: 103 mmol/L (ref 101–111)
CO2: 26 mmol/L (ref 22–32)
Calcium: 9.1 mg/dL (ref 8.9–10.3)
Creatinine, Ser: 0.71 mg/dL (ref 0.44–1.00)
GFR calc Af Amer: >60 mL/min (ref >60)
GFR calc non Af Amer: >60 mL/min (ref >60)
GLUCOSE: 120 mg/dL — AB (ref 65–99)
PHOSPHORUS: 3.5 mg/dL (ref 2.5–4.6)
Potassium: 3.5 mmol/L (ref 3.5–5.1)
Sodium: 140 mmol/L (ref 135–145)

## 2015-02-21 LAB — CBC WITH DIFFERENTIAL/PLATELET
BASOS ABS: 0 10*3/uL (ref 0.0–0.1)
BASOS PCT: 0 %
Eosinophils Absolute: 0.4 10*3/uL (ref 0.0–0.7)
Eosinophils Relative: 4 %
HEMATOCRIT: 31.5 % — AB (ref 36.0–46.0)
HEMOGLOBIN: 9.9 g/dL — AB (ref 12.0–15.0)
LYMPHS PCT: 23 %
Lymphs Abs: 2.6 10*3/uL (ref 0.7–4.0)
MCH: 26.9 pg (ref 26.0–34.0)
MCHC: 31.4 g/dL (ref 30.0–36.0)
MCV: 85.6 fL (ref 78.0–100.0)
MONO ABS: 0.7 10*3/uL (ref 0.1–1.0)
Monocytes Relative: 6 %
NEUTROS ABS: 7.9 10*3/uL — AB (ref 1.7–7.7)
NEUTROS PCT: 67 %
Platelets: 235 10*3/uL (ref 150–400)
RBC: 3.68 MIL/uL — ABNORMAL LOW (ref 3.87–5.11)
RDW: 14.3 % (ref 11.5–15.5)
WBC: 11.7 10*3/uL — ABNORMAL HIGH (ref 4.0–10.5)

## 2015-02-21 LAB — ANTINUCLEAR ANTIBODIES, IFA: ANTINUCLEAR ANTIBODIES, IFA: POSITIVE — AB

## 2015-02-21 LAB — GLUCOSE, CAPILLARY
GLUCOSE-CAPILLARY: 104 mg/dL — AB (ref 65–99)
GLUCOSE-CAPILLARY: 108 mg/dL — AB (ref 65–99)
GLUCOSE-CAPILLARY: 113 mg/dL — AB (ref 65–99)
GLUCOSE-CAPILLARY: 91 mg/dL (ref 65–99)
Glucose-Capillary: 102 mg/dL — ABNORMAL HIGH (ref 65–99)
Glucose-Capillary: 96 mg/dL (ref 65–99)

## 2015-02-21 LAB — MAGNESIUM: MAGNESIUM: 2 mg/dL (ref 1.7–2.4)

## 2015-02-21 LAB — FANA STAINING PATTERNS: Speckled Pattern: 1:320 {titer} — ABNORMAL HIGH

## 2015-02-21 LAB — CENTROMERE ANTIBODIES: CENTROMERE AB SCREEN: NEGATIVE

## 2015-02-21 LAB — HEMOGLOBIN A1C
HEMOGLOBIN A1C: 6.4 % — AB (ref 4.8–5.6)
MEAN PLASMA GLUCOSE: 137 mg/dL

## 2015-02-21 LAB — MRSA PCR SCREENING: MRSA by PCR: NEGATIVE

## 2015-02-21 LAB — ANTI-SCLERODERMA ANTIBODY: SCLERODERMA (SCL-70) (ENA) ANTIBODY, IGG: 3.7 AI — AB (ref 0.0–0.9)

## 2015-02-21 LAB — TRIGLYCERIDES: TRIGLYCERIDES: 249 mg/dL — AB (ref <150)

## 2015-02-21 MED ORDER — CETYLPYRIDINIUM CHLORIDE 0.05 % MT LIQD
7.0000 mL | Freq: Two times a day (BID) | OROMUCOSAL | Status: DC
  Administered 2015-02-22 (×2): 7 mL via OROMUCOSAL

## 2015-02-21 MED ORDER — CHLORHEXIDINE GLUCONATE 0.12 % MT SOLN
15.0000 mL | Freq: Two times a day (BID) | OROMUCOSAL | Status: DC
  Administered 2015-02-21 – 2015-02-25 (×6): 15 mL via OROMUCOSAL
  Filled 2015-02-21 (×5): qty 15

## 2015-02-21 NOTE — Progress Notes (Signed)
Patient ID: Erin Galloway, female   DOB: Mar 05, 1956, 59 y.o.   MRN: [id number]          Subjective: Pt s/p Lt common carotid arteriogram,followed by complete revascularization of occluded LT LMCA M 1 With x1 pass with 83mm x 30 mm Trevoprovue retrieval device and 2.5 mg of superselective intracranial TPA 11/30 ; still intubated; opens eyes; follows some commands   Allergies: Review of patient's allergies indicates no known allergies.  Medications: Prior to Admission medications   Medication Sig Start Date End Date Taking? Authorizing Provider  amLODipine (NORVASC) 5 MG tablet Take 5 mg by mouth daily.   Yes Historical Provider, MD  aspirin-acetaminophen-caffeine (EXCEDRIN MIGRAINE) [phone] MG tablet Take 1 tablet by mouth every 6 (six) hours as needed for headache.   Yes Historical Provider, MD  atorvastatin (LIPITOR) 20 MG tablet Take 20 mg by mouth daily.   Yes Historical Provider, MD     Vital Signs: BP 163/83 mmHg  Pulse 103  Temp(Src) 100.6 F (38.1 C) (Axillary)  Resp 13  Ht 5\' 4"  (1.626 m)  Wt 139 lb (63.05 kg)  BMI 23.85 kg/m2  SpO2 100%  Physical Examintubated, FC, PERRL, purposeful movement noted in all fours , sl weakest with RLE; R/L groin sites soft, no distinct hematomas, some purple mottling of left first/second toes ? Raynuaud's, distal pulses still dopplerable  Imaging: Ct Angio Head W/cm &/or Wo Cm  02/19/2015  CLINICAL DATA:  Initial evaluation for acute onset right-sided weakness with aphasia. EXAM: CT ANGIOGRAPHY HEAD AND NECK TECHNIQUE: Multidetector CT imaging of the head and neck was performed using the standard protocol during bolus administration of intravenous contrast. Multiplanar CT image reconstructions and MIPs were obtained to evaluate the vascular anatomy. Carotid stenosis measurements (when applicable) are obtained utilizing NASCET criteria, using the distal internal carotid diameter as the denominator. CONTRAST:  8mL OMNIPAQUE IOHEXOL  350 MG/ML SOLN COMPARISON:  Prior noncontrast head CT from earlier the same day. FINDINGS: CTA NECK Aortic arch: Mild scattered plaque present within the visualize aortic arch. The arch itself is of normal caliber with normal 3 vessel morphology. No high-grade stenosis seen at the origin of the great vessels. Atheromatous plaque at the origin of the right subclavian artery without high-grade stenosis. Right carotid system: Right common carotid artery widely patent from its origin to the carotid bifurcation. Atheromatous plaque about the carotid bifurcation/proximal right ICA without hemodynamically significant stenosis. Right ICA is widely patent to the skullbase. Left carotid system: Left common carotid artery widely patent from its origin to the carotid bifurcation. Scattered calcified atheromatous plaque about the left carotid bifurcation extending into the proximal left ICA. Associated short-segment stenosis of up to 40-50% by NASCET criteria within the proximal left ICA. Left ICA well opacified distally to the skullbase. Vertebral arteries:Both vertebral arteries arise from the subclavian arteries. Right vertebral artery is dominant. There is focal plaque at the origin of the left vertebral artery with probable severe stenosis dominant right vertebral artery widely patent at its origin. Vertebral arteries otherwise well opacified to the skullbase without stenosis or evidence of dissection. The left vertebral artery is diffusely diminutive. Skeleton: No acute osseous abnormality. No worrisome lytic or blastic osseous lesions. Mild degenerative spondylolysis within the cervical spine, most evident at C4-5. Other neck: Visualized lungs are clear. Scattered hypodense nodules noted within the thyroid. No adenopathy. No acute soft tissue abnormality within the neck. CTA HEAD Anterior circulation: Petrous segments are widely patent. Multi focal atheromatous plaque with irregularity present within  the cavernous/  supraclinoid ICAs with associated mild to moderate multi focal narrowing. ICA termini widely patent. A1 segments well opacified. Anterior communicating artery normal. Anterior cerebral arteries well opacified. On the left, there is a focal filling defect within the mid left M1 segment, suspicious for nonocclusive thrombus (series 6, image 223). There is opacification of the M1 segment distally, although flow was somewhat attenuated and irregular. Left MCA bifurcation is normal. Flow is seen within proximal left M2 branches, however, there is minimal to stent flow distally within the left MCA branches as compared to the right. Right M1 segment widely patent without stenosis or occlusion. Right MCA bifurcation normal. Right MCA branches well opacified. Posterior circulation: Dominant right vertebral artery widely patent to the vertebrobasilar junction. Diminutive left vertebral artery demonstrates multi focal irregularity with narrowing but is patent as well. Posterior inferior cerebral arteries patent bilaterally. Basilar artery somewhat diminutive but widely patent. Superior cerebral arteries opacified proximally. There is fetal origin of the right PCA with a widely patent right posterior communicating artery. Right PCA is opacified to its distal aspect. Left P1 and P2 segments well opacified. A small left posterior communicating artery present as well. Venous sinuses: No filling defect to suggest venous sinus thrombosis. Anatomic variants: Fetal origin of the right PCA.  No aneurysm. Delayed phase: Not performed. IMPRESSION: CTA NECK IMPRESSION: 1. Atheromatous plaque about the left carotid bifurcation/proximal left ICA with associated short segment stenosis of approximately 40-50% by NASCET criteria. 2. Mild atheromatous plaque about the right carotid bifurcation/proximal right ICA without hemodynamically significant stenosis. 3. 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 The dominant right vertebral artery is widely patent along its entire course. CTA HEAD IMPRESSION: 1. Short segment focal defect within the mid left M1 segment, worrisome for nonocclusive thrombus. There may be a superimposed stenosis at this level as well. Flow is present within the left M1 segment distally as well as the proximal M2 branches, however, the MCA branches are markedly attenuated and possibly occluded distally. 2. Atheromatous disease within the cavernous ICAs with associated mild to moderate multi focal irregularity and stenosis. 3. Irregularity with multi focal moderate to severe narrowing of the left V4 segment. Dominant right vertebral artery widely patent. Critical Value/emergent results were called by telephone at the time of interpretation on 02/19/2015 at 4:56 am to Dr. Dorian Pod , who verbally acknowledged these results. Electronically Signed   By: Jeannine Boga M.D.   On: 02/19/2015 05:50   Ct Head Wo Contrast  02/21/2015  CLINICAL DATA:  Followup stroke/tPA. EXAM: CT HEAD WITHOUT CONTRAST TECHNIQUE: Contiguous axial images were obtained from the base of the skull through the vertex without intravenous contrast. COMPARISON:  MRI of the brain February 20, 2015 and CT head February 20, 2015 FINDINGS: Two similar LEFT temporal occipital lobe intraparenchymal hematomas measuring up to 2 cm with surrounding low-density vasogenic edema. In addition, wedge-like hypodensity LEFT frontotemporal parietal lobe corresponding to known infarct. Patchy LEFT basal ganglia involvement. Local sulcal effacement. No significant midline shift. Patchy wedge-like hypodensities RIGHT cerebrum with faint hemorrhage. In addition, faint linear cortical density LEFT frontal gyrus. Small bilateral cerebellar infarcts better seen on prior MRI. No abnormal extra-axial fluid collections. Basal cisterns are patent. Mild calcific atherosclerosis of the carotid siphons. Ocular globes and  orbital contents are unremarkable. Minimal LEFT sphenoid mucosal thickening. Minimal bilateral mastoid air cell soft tissue opacification. No skull fracture. IMPRESSION: Evolving large LEFT MCA territory infarct with petechial  hemorrhage. Two similar hematomas within the LEFT temporal occipital lobe. Multifocal small areas of acute ischemia and petechial hemorrhage RIGHT cerebrum. Small cerebellar infarcts better seen on prior MRI. Electronically Signed   By: Elon Alas M.D.   On: 02/21/2015 04:13   Ct Head Wo Contrast  02/20/2015  CLINICAL DATA:  Follow-up examination for stroke, status post catheter directed thrombolytic therapy. EXAM: CT HEAD WITHOUT CONTRAST TECHNIQUE: Contiguous axial images were obtained from the base of the skull through the vertex without intravenous contrast. COMPARISON:  Prior study from 02/19/2015. FINDINGS: Large confluent evolving left MCA territory infarct, better evaluated on brain MRI performed on the same day. There is involvement of the left basal ganglia including the caudate and lentiform nuclei. Evidence of hemorrhagic transformation again seen, with the left occipital hematoma measuring 21 mm, and the posterior left temporal hematoma measuring 15 mm. Overall, these are similar relative to previous study. No significant mass effect or evidence of midline shift. Superimposed remote infarct within the left frontal lobe again noted. Additional patchy evolving infarcts involving the right parietal and occipital regions again noted. Probable associated faint hemorrhage within the right parietal region (series 201, image 16). This is slightly less prominent as compared to previous exam. Known tiny cerebellar infarcts not well visualized. No hydrocephalus.  No extra-axial fluid collection. Scalp soft tissues within normal limits. No acute abnormality about the orbits. Minimal opacity within the left sphenoid sinus. Paranasal sinuses are otherwise clear. No mastoid effusion.  Calvarium unchanged. IMPRESSION: 1. Evolving large MCA territory infarct and additional patchy right cerebral infarcts. Findings are better delineated on MRI of the brain performed on the same day. 2. Stable posterior left temporal and occipital hemorrhages. 3. Decreased conspicuity of small volume right parietal hemorrhage. Electronically Signed   By: Jeannine Boga M.D.   On: 02/20/2015 06:32   Ct Head Wo Contrast  02/19/2015  CLINICAL DATA:  Stroke. Status post revascularization of the left middle cerebral artery. EXAM: CT HEAD WITHOUT CONTRAST TECHNIQUE: Contiguous axial images were obtained from the base of the skull through the vertex without intravenous contrast. COMPARISON:  CT head and CTA head from the same day. FINDINGS: A large left MCA territory infarct is now apparent with significant loss of gray-white differential throughout the left MCA territory. The remote anterior left frontal lobe infarct is again seen. Focal parenchymal hemorrhages involving the posterior left temporal lobe are stable. Right parietal and frontal lobe infarcts are stable. Previously noted density is diminished suggesting this likely represents parenchymal enhancement rather than hemorrhage. The focus of right parietal hemorrhage is stable. Ventricles are of normal size. There is no significant midline shift. The paranasal sinuses and mastoid air cells are clear. The calvarium is intact. IMPRESSION: 1. Stable appearance of posterior left temporal hemorrhages. 2. Evolving large left MCA territory infarct. 3. Densities in the right frontal and parietal lobes likely represents evolving infarcts with decreasing parenchymal enhancement. 4. Small right parietal hemorrhage is stable. These results were called by telephone at the time of interpretation on 02/19/2015 at 4:55 pm to Dr. Luanne Bras AND PRAMOD SETHI, who verbally acknowledged these results. Electronically Signed   By: San Morelle M.D.   On:  02/19/2015 16:56   Ct Head Wo Contrast  02/19/2015  CLINICAL DATA:  Stroke. 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 EXAM: CT HEAD WITHOUT CONTRAST TECHNIQUE: Contiguous axial images were obtained from the base of the skull through the vertex without intravenous contrast. COMPARISON:  CTA earlier today FINDINGS: There  are 2 hematomas in the left occipital and occipital temporal region, the larger is centered in the occipital lobe measuring 22 mm in maximal dimension with hematocrit level. The smaller measures up to 23 x 11 mm. There is also surface high-density multi focal along the bilateral cerebral hemispheres, both anterior and posterior circulation. Only a left carotid angiogram was performed; best explanation is subacute infarcts with cortical enhancement. There is an area of mature gliosis in the high posterior left frontal lobe, MCA territory, consistent with chronic infarct. No hydrocephalus or shift. These results were called by telephone at the time of interpretation on 02/19/2015 at 8:28 am to Dr. Luanne Bras , who is already aware of the findings. These results were called by telephone at the time of interpretation on 02/19/2015 at 8:32 am to PA Bidy, who verbally acknowledged these results. IMPRESSION: 1. Hemorrhagic conversion in the left occipital lobe infarcts with two hematomas up to 22 mm diameter. 2. Multi focal bilateral cerebral convexity high density is likely enhancement from subacute infarct. Pattern suggests recurrent central embolic disease. Electronically Signed   By: Monte Fantasia M.D.   On: 02/19/2015 09:07   Ct Head Wo Contrast  02/19/2015  CLINICAL DATA:  Right-sided weakness, last seen normal at 0 3:00 EXAM: CT HEAD WITHOUT CONTRAST TECHNIQUE: Contiguous axial images were obtained from the base of the skull through the vertex without intravenous contrast. COMPARISON:  None. FINDINGS: There is no intracranial hemorrhage or extra-axial fluid  collection. There is encephalomalacia due to remote infarction in the posterior left frontal lobe. There is remote infarction in the right parieto-occipital region and possibly a smaller area of remote infarction in the left occipital lobe. No acute infarction is evident. There is mild generalized atrophy. There is white matter hypodensity suggesting chronic small vessel disease. No bone abnormality is evident. The visible paranasal sinuses are clear. IMPRESSION: Remote infarctions with encephalomalacia. Generalized atrophy and chronic white matter changes consistent with small vessel disease. No acute intracranial findings. Critical Value/emergent results were called by telephone at the time of interpretation on 02/19/2015 at 4:25 am to Dr. Everlene Balls , who verbally acknowledged these results. Electronically Signed   By: Andreas Newport M.D.   On: 02/19/2015 04:28   Ct Angio Neck W/cm &/or Wo/cm  02/19/2015  CLINICAL DATA:  Initial evaluation for acute onset right-sided weakness with aphasia. EXAM: CT ANGIOGRAPHY HEAD AND NECK TECHNIQUE: Multidetector CT imaging of the head and neck was performed using the standard protocol during bolus administration of intravenous contrast. Multiplanar CT image reconstructions and MIPs were obtained to evaluate the vascular anatomy. Carotid stenosis measurements (when applicable) are obtained utilizing NASCET criteria, using the distal internal carotid diameter as the denominator. CONTRAST:  94mL OMNIPAQUE IOHEXOL 350 MG/ML SOLN COMPARISON:  Prior noncontrast head CT from earlier the same day. FINDINGS: CTA NECK Aortic arch: Mild scattered plaque present within the visualize aortic arch. The arch itself is of normal caliber with normal 3 vessel morphology. No high-grade stenosis seen at the origin of the great vessels. Atheromatous plaque at the origin of the right subclavian artery without high-grade stenosis. Right carotid system: Right common carotid artery widely  patent from its origin to the carotid bifurcation. Atheromatous plaque about the carotid bifurcation/proximal right ICA without hemodynamically significant stenosis. Right ICA is widely patent to the skullbase. Left carotid system: Left common carotid artery widely patent from its origin to the carotid bifurcation. Scattered calcified atheromatous plaque about the left carotid bifurcation extending into the proximal left  ICA. Associated short-segment stenosis of up to 40-50% by NASCET criteria within the proximal left ICA. Left ICA well opacified distally to the skullbase. Vertebral arteries:Both vertebral arteries arise from the subclavian arteries. Right vertebral artery is dominant. There is focal plaque at the origin of the left vertebral artery with probable severe stenosis dominant right vertebral artery widely patent at its origin. Vertebral arteries otherwise well opacified to the skullbase without stenosis or evidence of dissection. The left vertebral artery is diffusely diminutive. Skeleton: No acute osseous abnormality. No worrisome lytic or blastic osseous lesions. Mild degenerative spondylolysis within the cervical spine, most evident at C4-5. Other neck: Visualized lungs are clear. Scattered hypodense nodules noted within the thyroid. No adenopathy. No acute soft tissue abnormality within the neck. CTA HEAD Anterior circulation: Petrous segments are widely patent. Multi focal atheromatous plaque with irregularity present within the cavernous/ supraclinoid ICAs with associated mild to moderate multi focal narrowing. ICA termini widely patent. A1 segments well opacified. Anterior communicating artery normal. Anterior cerebral arteries well opacified. On the left, there is a focal filling defect within the mid left M1 segment, suspicious for nonocclusive thrombus (series 6, image 223). There is opacification of the M1 segment distally, although flow was somewhat attenuated and irregular. Left MCA  bifurcation is normal. Flow is seen within proximal left M2 branches, however, there is minimal to stent flow distally within the left MCA branches as compared to the right. Right M1 segment widely patent without stenosis or occlusion. Right MCA bifurcation normal. Right MCA branches well opacified. Posterior circulation: Dominant right vertebral artery widely patent to the vertebrobasilar junction. Diminutive left vertebral artery demonstrates multi focal irregularity with narrowing but is patent as well. Posterior inferior cerebral arteries patent bilaterally. Basilar artery somewhat diminutive but widely patent. Superior cerebral arteries opacified proximally. There is fetal origin of the right PCA with a widely patent right posterior communicating artery. Right PCA is opacified to its distal aspect. Left P1 and P2 segments well opacified. A small left posterior communicating artery present as well. Venous sinuses: No filling defect to suggest venous sinus thrombosis. Anatomic variants: Fetal origin of the right PCA.  No aneurysm. Delayed phase: Not performed. IMPRESSION: CTA NECK IMPRESSION: 1. Atheromatous plaque about the left carotid bifurcation/proximal left ICA with associated short segment stenosis of approximately 40-50% by NASCET criteria. 2. Mild atheromatous plaque about the right carotid bifurcation/proximal right ICA without hemodynamically significant stenosis. 3. 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 The dominant right vertebral artery is widely patent along its entire course. CTA HEAD IMPRESSION: 1. Short segment focal defect within the mid left M1 segment, worrisome for nonocclusive thrombus. There may be a superimposed stenosis at this level as well. Flow is present within the left M1 segment distally as well as the proximal M2 branches, however, the MCA branches are markedly attenuated and possibly occluded distally. 2.  Atheromatous disease within the cavernous ICAs with associated mild to moderate multi focal irregularity and stenosis. 3. Irregularity with multi focal moderate to severe narrowing of the left V4 segment. Dominant right vertebral artery widely patent. Critical Value/emergent results were called by telephone at the time of interpretation on 02/19/2015 at 4:56 am to Dr. Dorian Pod , who verbally acknowledged these results. Electronically Signed   By: Jeannine Boga M.D.   On: 02/19/2015 05:50   Mr Brain Wo Contrast  02/20/2015  CLINICAL DATA:  Initial evaluation for acute right-sided weakness, status post catheter directed thrombolytic  therapy. EXAM: MRI HEAD WITHOUT CONTRAST TECHNIQUE: Multiplanar, multiecho pulse sequences of the brain and surrounding structures were obtained without intravenous contrast. COMPARISON:  Prior CTs from 02/19/2015. FINDINGS: Large confluent left MCA territory infarct involving almost the entirety of the left MCA distribution including the left frontal, parietal, and temporal lobes is seen. There is involvement of the left basal ganglia including the caudate and putamen. Associated gyral swelling and edema throughout the area of infarction. Again, evidence of hemorrhagic transformation with a left occipital hematoma measuring 16 x 22 mm, and a posterior left temporal hemorrhage measuring 11 x 15 mm. Fluid fluid level within the occipital hematoma. Superimposed remote left frontal infarct with associated encephalomalacia again noted. Additional patchy infarcts involving the cortical gray matter as well as the deep and subcortical white matter seen scattered throughout the right frontal, parietal, occipital, and posterior temporal lobes. Evidence of associated hemorrhage in the right parieto-occipital region (series 10, image 17, 14). Superimposed laminar necrosis likely present as well. Patchy subcentimeter acute ischemic infarcts within the bilateral cerebellar hemispheres  without associated hemorrhage or mass effect. No brainstem infarct. Diminished flow void within the diminutive left vertebral artery. Major intracranial vascular flow voids otherwise maintained. No significant mass effect. No midline shift. No hydrocephalus. No extra-axial fluid collection. No mass lesion identified. Craniocervical junction within normal limits. Degenerative spondylolysis noted within the upper cervical spine without significant stenosis. 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 No acute abnormality about the orbits. Mild mucosal thickening within the ethmoidal air cells and maxillary sinuses. Trace opacity within the mastoid air cells. Inner ear structures normal. Bone marrow signal intensity within normal limits. Scalp soft tissues unremarkable. IMPRESSION: 1. Evolving large confluent left MCA territory infarct, with additional patchy infarcts within the right frontal, parietal, and occipital regions, with additional small scattered bilateral cerebellar infarcts. 2. Stable hematomas within the left temporal and occipital lobes, consistent with hemorrhagic transformation. Small volume hemorrhage in the right parietal region also stable. Electronically Signed   By: Jeannine Boga M.D.   On: 02/20/2015 06:20   Dg Chest Port 1 View  02/20/2015  CLINICAL DATA:  Stroke was cerebral ischemia and respiratory failure EXAM: PORTABLE CHEST 1 VIEW COMPARISON:  None in PACs FINDINGS: The lungs are well-expanded. There is hazy density that projects peripherally in the left lower lung. There is no pleural effusion or pneumothorax. The heart and pulmonary vascularity are normal. The endotracheal tube tip lies 4.4 cm above the carina. IMPRESSION: Patchy density peripherally in the left lower lung likely reflecting atelectasis or early pneumonia. The endotracheal tube is in reasonable position radiographically. Electronically Signed   By: David  Martinique M.D.   On: 02/20/2015 08:03     Labs:  CBC:  Recent Labs  02/19/15 2245 02/20/15 0330 02/20/15 0655 02/21/15 0314  WBC 11.7* 12.3* 10.4 11.7*  HGB 10.5* 10.6* 10.0* 9.9*  HCT 32.0* 32.1* 31.0* 31.5*  PLT 179 213 209 235    COAGS:  Recent Labs  02/19/15 0404  INR 1.11  APTT 25    BMP:  Recent Labs  02/19/15 0404 02/19/15 0414 02/20/15 0330 02/21/15 0314  NA 139 140 140 140  K 3.7 3.7 3.5 3.5  CL 101 100* 105 103  CO2 28  --  26 26  GLUCOSE 121* 122* 132* 120*  BUN 10 13 6 6   CALCIUM 10.2  --  9.0 9.1  CREATININE 0.96 1.00 0.72 0.71  GFRNONAA >60  --  >60 >60  GFRAA >60  --  >  60 >60    LIVER FUNCTION TESTS:  Recent Labs  02/19/15 0404 02/21/15 0314  BILITOT 0.4  --   AST 23  --   ALT 16  --   ALKPHOS 100  --   PROT 7.5  --   ALBUMIN 3.9 3.1*    Assessment and Plan: [facility] CVA S/p Lt common carotid arteriogram,followed by complete revascularization of occluded LT LMCA M 1 With x1 pass with 22mm x 30 mm Trevoprovue retrieval device and 2.5 mg of superselective intracranial TPA 11/30; temp 100.6, wbc 11.7, hgb 9.9, creat .71; CT head today with evolving large left MCA CVA and two similar hematomas within left temp occipital lobe, multifocal areas of acute ischemia/petechial hemorrhage right cerebrum. 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 Plans as per neuro/CCM/TCTS.    Signed: D. Rowe Robert 02/21/2015, 10:34 AM   I spent a total of 15 minutes at the the patient's bedside AND on the patient's hospital floor or unit, greater than 50% of which was counseling/coordinating care for cerebral angio with revascularization of left MCA

## 2015-02-21 NOTE — Procedures (Signed)
Extubation Procedure Note  Patient Details:   Name: Erin Galloway DOB: 03/30/1955 MRN: [id number]   Airway Documentation:  Airway 7.5 mm (Active)  Secured at (cm) 22 cm 02/21/2015 11:48 AM  Measured From Lips 02/21/2015 11:48 AM  Wisdom 02/21/2015 11:48 AM  Secured By [employer] 02/21/2015 11:48 AM  Tube Holder Repositioned Yes 02/21/2015 11:48 AM  Cuff Pressure (cm H2O) 26 cm H2O 02/21/2015  4:22 AM  Site Condition Dry 02/21/2015 11:48 AM  Pt extubated to 4lpm [place], no distress at this time.   Evaluation  O2 sats: stable throughout Complications: No apparent complications Patient did tolerate procedure well. Bilateral Breath Sounds: Rhonchi Suctioning: Oral, Airway Yes  Meili Kleckley Wyatt Haste 02/21/2015, 12:15 PM

## 2015-02-21 NOTE — Progress Notes (Signed)
OT Cancellation Note  Patient Details Name: Erin Galloway MRN: [id number] DOB: 01-30-1956   Cancelled Treatment:    Reason Eval/Treat Not Completed: Medical issues which prohibited therapy (BR)  Peri Maris  [phone] 02/21/2015, 6:45 AM

## 2015-02-21 NOTE — Progress Notes (Signed)
STROKE TEAM PROGRESS NOTE   SUBJECTIVE (INTERVAL HISTORY) Patient more awake this am, looking better than her scan per Dr. Leonie Man. No family/friends present. CT scan shows stable appearance of brain hemorrhage as well as infarct and cytotoxic edema   OBJECTIVE Temp:  [99.3 F (37.4 C)-100.6 F (38.1 C)] 100.6 F (38.1 C) (12/02 0741) Pulse Rate:  [87-126] 103 (12/02 0800) Cardiac Rhythm:  [-] Sinus tachycardia (12/02 0748) Resp:  [12-20] 12 (12/02 0800) BP: (126-175)/(63-123) 166/78 mmHg (12/02 0800) SpO2:  [100 %] 100 % (12/02 0800) FiO2 (%):  [35 %-40 %] 40 % (12/02 0741)  CBC:   Recent Labs Lab 02/20/15 0330 02/20/15 0655 02/21/15 0314  WBC 12.3* 10.4 11.7*  NEUTROABS 8.2*  --  7.9*  HGB 10.6* 10.0* 9.9*  HCT 32.1* 31.0* 31.5*  MCV 84.0 83.8 85.6  PLT 213 209 [id number]    Basic Metabolic Panel:   Recent Labs Lab 02/20/15 0330 02/21/15 0314  NA 140 140  K 3.5 3.5  CL 105 103  CO2 26 26  GLUCOSE 132* 120*  BUN 6 6  CREATININE 0.72 0.71  CALCIUM 9.0 9.1  MG 1.8 2.0  PHOS 4.8* 3.5    Lipid Panel:     Component Value Date/Time   CHOL 216* 02/20/2015 0330   TRIG 249* 02/21/2015 0314   HDL 27* 02/20/2015 0330   CHOLHDL 8.0 02/20/2015 0330   VLDL UNABLE TO CALCULATE IF TRIGLYCERIDE OVER 400 mg/dL 02/20/2015 0330   LDLCALC UNABLE TO CALCULATE IF TRIGLYCERIDE OVER 400 mg/dL 02/20/2015 0330   HgbA1c:  Lab Results  Component Value Date   HGBA1C 6.4* 02/20/2015   Urine Drug Screen: No results found for: LABOPIA, COCAINSCRNUR, LABBENZ, AMPHETMU, THCU, LABBARB    IMAGING  Ct Head Wo Contrast 02/21/2015 Evolving large LEFT MCA territory infarct with petechial hemorrhage. Two similar hematomas within the LEFT temporal occipital lobe. Multifocal small areas of acute ischemia and petechial hemorrhage, RIGHT cerebrum. Small cerebellar infarcts better seen on prior MRI. 02/20/2015 1. Evolving large MCA territory infarct and additional patchy right cerebral infarcts.  Findings are better delineated on MRI of the brain performed on the same day. 2. Stable posterior left temporal and occipital hemorrhages. 3. Decreased conspicuity of small volume right parietal hemorrhage. 02/19/2015 1656 1. Stable appearance of posterior left temporal hemorrhages. 2. Evolving large left MCA territory infarct. 3. Densities in the right frontal and parietal lobes likely represents evolving infarcts with decreasing parenchymal enhancement. 4. Small right parietal hemorrhage is stable. 02/19/2015 0907 1. Hemorrhagic conversion in the left occipital lobe infarcts with two hematomas up to 22 mm diameter. 2. Multi focal bilateral cerebral convexity high density is likely enhancement from subacute infarct. Pattern suggests recurrent central embolic disease.  02/19/2015 0428 Remote infarctions with encephalomalacia. Generalized atrophy and chronic white matter changes consistent with small vessel disease. No acute intracranial findings.   CTA NECK  02/19/2015 1. Atheromatous plaque about the left carotid bifurcation/proximal left ICA with associated short segment stenosis of approximately 40-50% by NASCET criteria. 2. Mild atheromatous plaque about the right carotid bifurcation/proximal right ICA without hemodynamically significant stenosis. 3. 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 The dominant right vertebral artery is widely patent along its entire course.   CTA HEAD  02/19/2015 1. Short segment focal defect within the mid left M1 segment, worrisome for nonocclusive thrombus. There may be a superimposed stenosis at this level as well. Flow is present within the left M1 segment  distally as well as the proximal M2 branches, however, the MCA branches are markedly attenuated and possibly occluded distally. 2. Atheromatous disease within the cavernous ICAs with associated mild to moderate multi focal irregularity and stenosis.  3. Irregularity with multi focal moderate to severe narrowing of the left V4 segment. Dominant right vertebral artery widely patent.   Cerebral Angiogram 02/19/2015 S/P Lt common carotid arteriogram,followed by complete revascularization of occluded LT LMCA M 1 With x1 pass with 58mm x 30 mm Trevoprovue retrieval device and 2.5 mg of superselective intracranial TPA  TICI 3 flow restored  MRI head 02/20/2015 1. Evolving large confluent left MCA territory infarct, with additional patchy infarcts within the right frontal, parietal, and occipital regions, with additional small scattered bilateral cerebellar infarcts. 2. Stable hematomas within the left temporal and occipital lobes, consistent with hemorrhagic transformation. Small volume hemorrhage in the right parietal region also stable.  Chest Xray 02/20/2015 Patchy density peripherally in the left lower lung likely reflecting atelectasis or early pneumonia. The endotracheal tube is in reasonable position radiographically.  2D echo  - Left ventricle: The cavity size was normal. Wall thickness wasincreased in a pattern of mild LVH. Systolic function was normal.The estimated ejection fraction was in the range of 60% to 65%.Wall motion was normal; there were no regional wall motionabnormalities. Doppler parameters are consistent with abnormalleft ventricular relaxation (grade 1 diastolic dysfunction). - Mitral valve: There was mild regurgitation. - Pulmonary arteries: Systolic pressure was moderately increased.PA peak pressure: 44 mm Hg (S).  TEE 0.6 x 0.5 cm mitral valve mass on the tip of the anteriorleaflet, suggestive of papillary fibroelastoma - vegetation,thrombus or Libman-Sacks endocarditis are less likely findings.    PHYSICAL EXAM Middle aged caucasian lady intubated and sedated. . . Afebrile. Head is nontraumatic. Neck is supple without bruit. Cardiac exam no murmur or gallop. Lungs are clear to auscultation. Distal pulses  are not well felt.  Neurological Exam : Eyes open partially. Patient is aphasic and will not follow any commands Left gaze preference but has spontaneous eye movements of the left Pupils 3 mm equal reactive. Corneal reflexes are present. Fundi were not visualized. Does not blink to threat on either side. Right lower facial weakness. Tongue midline. Spontaneous antigravity left sided movements. No spontaneous right-sided movement but will withdraw to pain in right upper and lower extremity.    ASSESSMENT/PLAN Erin Galloway is a 59 y.o. female with history of HTN and a stroke 6 weeks ago due to MV abnormality for which she refused surgery presenting with right hemiparesis, aphasia and L gaze preference. She received IV t-PA 02/19/2015 at 0436 and complete revascularization of L M1 with trevo and IA tPA.   Stroke: large left MCA territory infarcts, patchy R frontal, parietal and occipital infarcts and bilateral cerebellar infarcts s/p IV tPA and TICI3 revascularizaion of L M1 with mechanical embolectomy and IA tPA. Stroke in setting of L anterior frontal infarct 11/2014 with likely unrealized R parietal infarct since that admission. 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 Post tPA hemorrhage R parietal and hemorrhagic transformation L temorporal and occipital lobes.  Resultant Respiratory failure, global aphasia, right hemiparesis and L gaze deviation  subacute R parietal hemorrhagic infarct that remains hyperintense on initial CT   Post IR CT with L occipital hemorrhage. Unclear if post tPA hemorrhage is symptomatic at this time  MRI left MCA territory infarcts, patchy R frontal, parietal and occipital infarcts and  bilateral cerebellar infarcts   repeat CT head 12/1 and 12/2 stable - L hemorrhages, decreasing R parietal hemorrhage  2D Echo No SOE reported. Upon review with cardiology, MV abnormality  present.   TEE today with 0.6x0.5 mitral valve mass on the anterior leaflet suggestive of papillary fibroelastoma  LDL unable to calculate  HgbA1c 6.4  Lovenox 40 mg sq daily VTE prophylaxis.   Diet NPO time specified . We will likely need to be postextubation  No antithrombotic prior to admission, no aspirin at this time due to hemorrhage  Per boyfriend, pt had a stroke 6 weeks ago in [place] state thought to be related to a growth on her MV for which the doctors recommended surgery, which pt refused. Was treated with lovenox x 1 week, then stopped.  Consulted cardiothoracic surgery consult  - agrees likely papillary fibroblastoma, pt not a surgical candidate at this time given large stroke, can reconsider when she recovers from stroke  Therapy recommendations: pending   Disposition: pending   Acute hypoxic respiratory failure  Intubated for neuro intervention  CCM managing vent  Plan extubation today  Peripheral cyanosis  Possible Raynaud's vs ischemic event  Bilateral tips of hands and feet ecchymotic  Boyfriend reports this was present during previous hospitalization and MD's were not concerned  Labs - Anti-scleroderma antibody elevated at 3.7, centromere abx neg, ANA pending   Vaginal Bleeding  In presumed post menopausal female  Hgb 11.7 -> 10.0->9.9  Follow Hbg  Hypertension  BP 160/87 on arrival. Up to 186/73 post procedure  Started on cardene drip, now off  BP 115-175/63-88 past 24h (02/21/2015 @ 2:24 PM)   Hyperlipidemia  Home meds: lipitor 20  LDL unable to calculate , goal < 70  Resume statin once able to swallow or has a feeding tube  Plan continue statin at discharge  Other Stroke Risk Factors  Hx stroke/TIA  Hospital day # Iona Swisher for Pager information 02/21/2015 9:24 AM  I have personally examined this patient, reviewed notes, independently viewed imaging studies, participated  in medical decision making and plan of care. I have made any additions or clarifications directly to the above note. Agree with note above. Plan to extubate the patient today if tolerated. Hold aspirin due to intracerebral hemorrhage. Family not available at the bedside. This patient is critically ill and at significant risk of neurological worsening, death and care requires constant monitoring of vital signs, hemodynamics,respiratory and cardiac monitoring, extensive review of multiple databases, frequent neurological assessment, discussion with family, other specialists and medical decision making of high complexity.I have made any additions or clarifications directly to the above note.This critical care time does not reflect procedure time, or teaching time or supervisory time of PA/NP/Med Resident etc but could involve care discussion time.  I spent 30 minutes of neurocritical care time  in the care of  this patient.    Antony Contras, MD Medical Director [facility] Pager: [phone] 02/21/2015 5:29 PM    To contact Stroke Continuity provider, please refer to [email or web address] After hours, contact General Neurology

## 2015-02-21 NOTE — Progress Notes (Signed)
PULMONARY / CRITICAL CARE MEDICINE   Name: Erin Galloway MRN: [id number] DOB: 1955-05-03    ADMISSION DATE:  02/19/2015 CONSULTATION DATE:  02/19/2015  REFERRING MD: IR  CHIEF COMPLAINT:  Rt side weakness, code stroke  INITIAL PRESENTATION:  59 yo wf , former smoker, recently moved to [place], hx of stroke with some residual rt side weakness. Boyfriend Noted abrupt onset of rt side weakness, left gaze preference and code stroke was activated. MCA clot retrieval per IR 0700 11/30. Sedated on vent. Very little information available for PMH/  Sedated and with NMB on board. PCCM asked to manage vent. Note some question of vaginal bleeding, ? From femoral artery cannulation. We will monitor closely in ICU.  STUDIES:  TTE 11/30:  EF 60-65%. Normal wall motion. Grade 1 diastolic function. PASP 61mmHg. RV normal in size & function. No mass seen. MRI Brain 12/1: Evolving L MCA CVA w/ patchy infarcts right frontal, parietal, & occipital. Scattered bilateral cerebella infarcts. Stable hematomas left temporal & occipital w/ small hemorrhage right parietal stable. TEE:  Pending  CULTURES: None  ANTIBIOTICS: None  SIGNIFICANT EVENTS: 11/30 code stroke 11/30 IR for MCA thrombus  LINES/TUBES: 11/30 OETT>> 11/30 left fem a line>> 11/30 Foley>>  SUBJECTIVE:  Awake and interactive this AM.  VITAL SIGNS: BP 162/85 mmHg  Pulse 102  Temp(Src) 99.4 F (37.4 C) (Axillary)  Resp 14  Ht 5\' 4"  (1.626 m)  Wt 63.05 kg (139 lb)  BMI 23.85 kg/m2  SpO2 100%  HEMODYNAMICS:   VENTILATOR SETTINGS: Vent Mode:  [-] PSV FiO2 (%):  [35 %-40 %] 40 % Set Rate:  [14 bmp] 14 bmp Vt Set:  [440 mL] 440 mL PEEP:  [5 cmH20-8 cmH20] 5 cmH20 Pressure Support:  [5 cmH20] 5 cmH20 Plateau Pressure:  [15 cmH20-18 cmH20] 18 cmH20  INTAKE / OUTPUT: I/O last 3 completed shifts: In: 3474.2 [I.V.:3224.2; IV Piggyback:250] Out: [id number]  PHYSICAL EXAMINATION: General:  Awake. No acute  distress. Following commands on the left.  Integument:  Warm & dry. No rash on exposed skin. Cyanosis of digit tips. HEENT:  No scleral injection or icterus. Endotracheal tube in place. PERRL. Cardiovascular:  Regular rate. No edema. No appreciable JVD.  Pulmonary:  Good aeration & clear to auscultation bilaterally. Symmetric chest wall rise on ventilator. Abdomen: Soft. Normal bowel sounds. Nondistended. Neurological: Eyes open. Following commands on the left not right.  LABS:  CBC  Recent Labs Lab 02/20/15 0330 02/20/15 0655 02/21/15 0314  WBC 12.3* 10.4 11.7*  HGB 10.6* 10.0* 9.9*  HCT 32.1* 31.0* 31.5*  PLT 213 209 235   Coag's  Recent Labs Lab 02/19/15 0404  APTT 25  INR 1.11   BMET  Recent Labs Lab 02/19/15 0404 02/19/15 0414 02/20/15 0330 02/21/15 0314  NA 139 140 140 140  K 3.7 3.7 3.5 3.5  CL 101 100* 105 103  CO2 28  --  26 26  BUN 10 13 6 6   CREATININE 0.96 1.00 0.72 0.71  GLUCOSE 121* 122* 132* 120*   Electrolytes  Recent Labs Lab 02/19/15 0404 02/20/15 0330 02/21/15 0314  CALCIUM 10.2 9.0 9.1  MG  --  1.8 2.0  PHOS  --  4.8* 3.5   Sepsis Markers No results for input(s): LATICACIDVEN, PROCALCITON, O2SATVEN in the last 168 hours. ABG  Recent Labs Lab 02/19/15 0858 02/19/15 1647  PHART 7.492* 7.423  PCO2ART 31.4* 41.1  PO2ART 143.0* 352.0*   Liver Enzymes  Recent Labs Lab 02/19/15 0404 02/21/15  0314  AST 23  --   ALT 16  --   ALKPHOS 100  --   BILITOT 0.4  --   ALBUMIN 3.9 3.1*   Cardiac Enzymes No results for input(s): TROPONINI, PROBNP in the last 168 hours. Glucose  Recent Labs Lab 02/20/15 1617 02/20/15 1933 02/20/15 2342 02/21/15 0339 02/21/15 0748 02/21/15 1119  GLUCAP 106* 78 91 113* 104* 102*    Imaging Ct Head Wo Contrast  02/21/2015  CLINICAL DATA:  Followup stroke/tPA. EXAM: CT HEAD WITHOUT CONTRAST TECHNIQUE: Contiguous axial images were obtained from the base of the skull through the vertex  without intravenous contrast. COMPARISON:  MRI of the brain February 20, 2015 and CT head February 20, 2015 FINDINGS: Two similar LEFT temporal occipital lobe intraparenchymal hematomas measuring up to 2 cm with surrounding low-density vasogenic edema. In addition, wedge-like hypodensity LEFT frontotemporal parietal lobe corresponding to known infarct. Patchy LEFT basal ganglia involvement. Local sulcal effacement. No significant midline shift. Patchy wedge-like hypodensities RIGHT cerebrum with faint hemorrhage. In addition, faint linear cortical density LEFT frontal gyrus. Small bilateral cerebellar infarcts better seen on prior MRI. No abnormal extra-axial fluid collections. Basal cisterns are patent. Mild calcific atherosclerosis of the carotid siphons. Ocular globes and orbital contents are unremarkable. Minimal LEFT sphenoid mucosal thickening. Minimal bilateral mastoid air cell soft tissue opacification. No skull fracture. IMPRESSION: Evolving large LEFT MCA territory infarct with petechial hemorrhage. Two similar hematomas within the LEFT temporal occipital lobe. Multifocal small areas of acute ischemia and petechial hemorrhage RIGHT cerebrum. Small cerebellar infarcts better seen on prior MRI. Electronically Signed   By: Elon Alas M.D.   On: 02/21/2015 04:13   ASSESSMENT / PLAN:  PULMONARY A: Acute Hypoxic Respiratory Failure H/O tobacco abuse   P:   SBT to extubate. Monitor for airway protection. Titrate O2 for sat of 88-92%.  CARDIOVASCULAR A:  Possible Cardiac Mass H/O HTN Goal SBP per IR & Neurology Peripheral Cyanosis  P:  BP management by Neurology/IR Monitoring on telemetry TEE result noted, path pending.  RENAL A:   Urinary retention  P:   Continue foley catheter Monitoring UOP with foley Trending renal function daily with BUN/Creatinine  GASTROINTESTINAL A:   GI protection  P:   Protonix IV daily Holding on tube feeds for extubation  HEMATOLOGIC A:    S/P Left MCA clot retrieval Questionable vaginal bleeding Anemia - Hgb stable. No further bleeding post tPA.  P:  Activase per neuro Trending Hgb daily with CBC SCDs Chemical DVT prophylaxis when ok with Neurology/IR  INFECTIOUS A:   No acute issue  P:   Monitor for leukocytosis & fever  ENDOCRINE A:   Hyperglycemia - No h/o DM.  P:   Accu-Checks q4hr Low dose SSI algorithm  NEUROLOGIC A:   Left MCA CVA - Code stroke 11/30 @ 5am. S/P IR clot retrieval. H/O Ischemic stroke 11/2013 w/ residual right weakness  P:   RASS goal: 0 to -1 Hold sedation for extubation. Monitor airway protection. Further management per Neurology ?trach if fails.  FAMILY  - Updates: No family at bedside  - Inter-disciplinary family meet or Palliative Care meeting due by:  12/7  The patient is critically ill with multiple organ systems failure and requires high complexity decision making for assessment and support, frequent evaluation and titration of therapies, application of advanced monitoring technologies and extensive interpretation of multiple databases.   Critical Care Time devoted to patient care services described in this note is  35  Minutes.  This time reflects time of care of this signee Dr Jennet Maduro. This critical care time does not reflect procedure time, or teaching time or supervisory time of PA/NP/Med student/Med Resident etc but could involve care discussion time.  Rush Farmer, M.D. [facility] Pulmonary/Critical Care Medicine. Pager: [phone]. After hours pager: [phone].

## 2015-02-21 NOTE — Progress Notes (Signed)
Pt transported to CT by Dub Mikes, RT without event.

## 2015-02-21 NOTE — Progress Notes (Signed)
PT Cancellation Note  Patient Details Name: Erin Galloway MRN: [id number] DOB: 08-18-1955   Cancelled Treatment:    Reason Eval/Treat Not Completed: Patient not medically ready.  Pt remains on bedrest.  Please advance activity orders when appropriate for PT and mobility.     Fia Hebert, Thornton Papas 02/21/2015, 8:18 AM

## 2015-02-22 DIAGNOSIS — I5189 Other ill-defined heart diseases: Secondary | ICD-10-CM

## 2015-02-22 DIAGNOSIS — I63412 Cerebral infarction due to embolism of left middle cerebral artery: Principal | ICD-10-CM

## 2015-02-22 DIAGNOSIS — M359 Systemic involvement of connective tissue, unspecified: Secondary | ICD-10-CM | POA: Diagnosis present

## 2015-02-22 DIAGNOSIS — E785 Hyperlipidemia, unspecified: Secondary | ICD-10-CM | POA: Diagnosis present

## 2015-02-22 DIAGNOSIS — I348 Other nonrheumatic mitral valve disorders: Secondary | ICD-10-CM

## 2015-02-22 LAB — HEMOGLOBIN A1C
Hgb A1c MFr Bld: 6 % — ABNORMAL HIGH (ref 4.8–5.6)
Mean Plasma Glucose: 126 mg/dL

## 2015-02-22 LAB — CBC WITH DIFFERENTIAL/PLATELET
BASOS ABS: 0 10*3/uL (ref 0.0–0.1)
Basophils Relative: 0 %
EOS PCT: 3 %
Eosinophils Absolute: 0.3 10*3/uL (ref 0.0–0.7)
HEMATOCRIT: 27.7 % — AB (ref 36.0–46.0)
Hemoglobin: 9 g/dL — ABNORMAL LOW (ref 12.0–15.0)
LYMPHS ABS: 2.7 10*3/uL (ref 0.7–4.0)
LYMPHS PCT: 22 %
MCH: 27.3 pg (ref 26.0–34.0)
MCHC: 32.5 g/dL (ref 30.0–36.0)
MCV: 83.9 fL (ref 78.0–100.0)
MONO ABS: 1.1 10*3/uL — AB (ref 0.1–1.0)
Monocytes Relative: 9 %
NEUTROS ABS: 8.3 10*3/uL — AB (ref 1.7–7.7)
Neutrophils Relative %: 66 %
PLATELETS: 305 10*3/uL (ref 150–400)
RBC: 3.3 MIL/uL — AB (ref 3.87–5.11)
RDW: 13.8 % (ref 11.5–15.5)
WBC: 12.4 10*3/uL — ABNORMAL HIGH (ref 4.0–10.5)

## 2015-02-22 LAB — GLUCOSE, CAPILLARY
GLUCOSE-CAPILLARY: 66 mg/dL (ref 65–99)
GLUCOSE-CAPILLARY: 77 mg/dL (ref 65–99)
GLUCOSE-CAPILLARY: 91 mg/dL (ref 65–99)
Glucose-Capillary: 83 mg/dL (ref 65–99)
Glucose-Capillary: 86 mg/dL (ref 65–99)
Glucose-Capillary: 90 mg/dL (ref 65–99)
Glucose-Capillary: 98 mg/dL (ref 65–99)

## 2015-02-22 LAB — RENAL FUNCTION PANEL
Albumin: 2.7 g/dL — ABNORMAL LOW (ref 3.5–5.0)
Anion gap: 10 (ref 5–15)
BUN: 7 mg/dL (ref 6–20)
CHLORIDE: 102 mmol/L (ref 101–111)
CO2: 26 mmol/L (ref 22–32)
CREATININE: 0.66 mg/dL (ref 0.44–1.00)
Calcium: 9.1 mg/dL (ref 8.9–10.3)
GFR calc Af Amer: >60 mL/min (ref >60)
GFR calc non Af Amer: >60 mL/min (ref >60)
GLUCOSE: 104 mg/dL — AB (ref 65–99)
POTASSIUM: 3.5 mmol/L (ref 3.5–5.1)
Phosphorus: 3.9 mg/dL (ref 2.5–4.6)
Sodium: 138 mmol/L (ref 135–145)

## 2015-02-22 LAB — MAGNESIUM: Magnesium: 1.9 mg/dL (ref 1.7–2.4)

## 2015-02-22 LAB — TRIGLYCERIDES: TRIGLYCERIDES: 187 mg/dL — AB (ref <150)

## 2015-02-22 MED ORDER — INSULIN ASPART 100 UNIT/ML ~~LOC~~ SOLN: 0.0000 [IU] | Freq: Four times a day (QID) | SUBCUTANEOUS | Status: DC

## 2015-02-22 MED ORDER — LABETALOL HCL 5 MG/ML IV SOLN: 10.0000 mg | INTRAVENOUS | Status: DC | PRN

## 2015-02-22 NOTE — Evaluation (Signed)
Clinical/Bedside Swallow Evaluation Patient Details  Name: Erin Galloway MRN: [id number] Date of Birth: 13-Sep-1955  Today's Date: 02/22/2015 Time: SLP Start Time (ACUTE ONLY): 0751 SLP Stop Time (ACUTE ONLY): 0800 SLP Time Calculation (min) (ACUTE ONLY): 9 min  Past Medical History:  Past Medical History  Diagnosis Date  . Stroke [facility]) 2016    September   . Hypertension    Past Surgical History:  Past Surgical History  Procedure Laterality Date  . Induced abortion      Two abortions   . Radiology with anesthesia N/A 02/19/2015    Procedure: RADIOLOGY WITH ANESTHESIA;  Surgeon: Luanne Bras, MD;  Location: [facility];  Service: Radiology;  Laterality: N/A;   HPI:  Erin Galloway is an 59 y.o. female with a past medical history significant for HTN, ischemic stroke last September with mild residual right sided weakness, brought to the ED by EMS due to acute onset of right hemiparesis, aphasia and left gaze preference.  Husband said that they were in bed talking when suddenly she became confused, unable to speak and shortly thereafter couldn't move the right side and was constantly looking to the left .EMS was summoned and patient brought to the ED.   Assessment / Plan / Recommendation Clinical Impression  Clinical swallowing evaluation was completed.  Oral mech exam was unable to be completed as the patiient struggled to follow command oral motor movements.  The patient presented with oropharyngeal dysphagia characteryzed by poor lip closure, decreased bolus awareness, decreased hyo-laryngeal excursion and delayed swallow trigger.  Wet gurgly sound was noted post swallow.  Recommend keep the patient NPO pending re assessment for PO readiness.      Aspiration Risk  Moderate aspiration risk    Diet Recommendation   NPO  Medication Administration: Via alternative means    Other  Recommendations Oral Care Recommendations: Oral care QID   Follow up Recommendations       Frequency  and Duration min 3x week  2 weeks       Prognosis Prognosis for Safe Diet Advancement: Good Barriers to Reach Goals: Language deficits;Severity of deficits      Swallow Study   General Date of Onset: 02/19/15 HPI: Annique Galloway is an 59 y.o. female with a past medical history significant for HTN, ischemic stroke last September with mild residual right sided weakness, brought to the ED by EMS due to acute onset of right hemiparesis, aphasia and left gaze preference.  Husband said that they were in bed talking when suddenly she became confused, unable to speak and shortly thereafter couldn't move the right side and was constantly looking to the left .EMS was summoned and patient brought to the ED. Type of Study: Bedside Swallow Evaluation Previous Swallow Assessment: None noted.   Diet Prior to this Study: NPO Temperature Spikes Noted: Yes Respiratory Status: Nasal cannula History of Recent Intubation: Yes Length of Intubations (days): 3 days Date extubated: 02/21/15 Behavior/Cognition: Alert;Doesn't follow directions Oral Care Completed by SLP: No Self-Feeding Abilities: Total assist Patient Positioning: Upright in bed Baseline Vocal Quality: Not observed (Pt non verbal.  ) Volitional Cough: Cognitively unable to elicit Volitional Swallow: Unable to elicit    Oral/Motor/Sensory Function Overall Oral Motor/Sensory Function:  (Unable to assess due to inability to follow commands.  )   Ice Chips Ice chips: Impaired Presentation: Spoon Oral Phase Impairments: Reduced labial seal;Poor awareness of bolus;Reduced lingual movement/coordination Oral Phase Functional Implications: Prolonged oral transit Pharyngeal Phase Impairments: Suspected delayed Swallow;Decreased hyoid-laryngeal movement;Multiple swallows  Thin Liquid Thin Liquid: Impaired Presentation: Spoon Oral Phase Impairments: Reduced labial seal;Poor awareness of bolus Oral Phase Functional Implications: Prolonged oral  transit Pharyngeal  Phase Impairments: Suspected delayed Swallow;Decreased hyoid-laryngeal movement;Wet Vocal Quality                           Shelly Flatten, MA, CCC-SLP Acute Rehab SLP [phone]  Shelly Flatten N 02/22/2015,8:39 AM

## 2015-02-22 NOTE — Evaluation (Signed)
Speech Language Pathology Evaluation Patient Details Name: Erin Galloway MRN: [id number] DOB: [birth year] Today's Date: 02/22/2015 Time: [id number] SLP Time Calculation (min) (ACUTE ONLY): 15 min  Problem List:  Patient Active Problem List   Diagnosis Date Noted  . Cerebral hemorrhage ([place])   . Acute encephalopathy   . Stroke ([place]) 02/19/2015  . Stroke with cerebral ischemia ([place]) 02/19/2015  . Respiratory failure ([place])   . Essential hypertension    Past Medical History:  Past Medical History  Diagnosis Date  . Stroke [facility]) 2016    September   . Hypertension    Past Surgical History:  Past Surgical History  Procedure Laterality Date  . Induced abortion      Two abortions   . Radiology with anesthesia N/A 02/19/2015    Procedure: RADIOLOGY WITH ANESTHESIA;  Surgeon: Luanne Bras, MD;  Location: [facility];  Service: Radiology;  Laterality: N/A;   HPI:  Erin Galloway is an 59 y.o. female with a past medical history significant for HTN, ischemic stroke last September with mild residual right sided weakness, brought to the ED by EMS due to acute onset of right hemiparesis, aphasia and left gaze preference.  Husband said that they were in bed talking when suddenly she became confused, unable to speak and shortly thereafter couldn't move the right side and was constantly looking to the left .EMS was summoned and patient brought to the ED.   Assessment / Plan / Recommendation Clinical Impression  Linguistic evaluation was completed.  The patient presented with significant receptive and expressive language deficits.  Even given max verbal and visual cues the patient inconsistently followed simple commands.  She was unable to consistently answer yes/no questions or identify objects in a field of 2.  The patient made no attempts to vocalize or verbalize despite max verbal cues.  The patient will benefit from ST at this level of care and intense ST at next level of care.      SLP  Assessment  Patient needs continued Speech Lanaguage Pathology Services    Follow Up Recommendations  Inpatient Rehab    Frequency and Duration min 3x week  2 weeks      SLP Evaluation Prior Functioning  Cognitive/Linguistic Baseline: Information not available  Lives With: Significant other   Cognition  Overall Cognitive Status: Impaired/Different from baseline Orientation Level: Oriented to person    Comprehension  Auditory Comprehension Overall Auditory Comprehension: Impaired Yes/No Questions: Impaired Basic Biographical Questions: 51-75% accurate Commands: Impaired One Step Basic Commands: 0-24% accurate (significant improvement given visual cues.) Conversation:  (No verbalizations were observed.  ) Visual Recognition/Discrimination Discrimination: Not tested Reading Comprehension Reading Status: Unable to assess (comment)    Expression Expression Primary Mode of Expression:  (Pt is non verbal.) Verbal Expression Overall Verbal Expression: Impaired Initiation: Impaired Automatic Speech:  (No automatic speech observed.  ) Level of Generative/Spontaneous Verbalization:  (No verbalizations were observed.  ) Repetition: Impaired Level of Impairment: Word level Naming: Impairment Confrontation: Impaired Convergent: 0-24% accurate (even given max verbal cues)   Oral / Motor Oral Motor/Sensory Function Overall Oral Motor/Sensory Function:  (Unable to assess due to inability to follow commands.) Motor Speech Overall Motor Speech:  (Unable to assess due to no verbal output.) Respiration: Within functional limits   Shelly Flatten, MA, CCC-SLP Acute Rehab SLP [phone]  Lamar Sprinkles 02/22/2015, 8:47 AM

## 2015-02-22 NOTE — Progress Notes (Signed)
PULMONARY / CRITICAL CARE MEDICINE   Name: Erin Galloway MRN: [id number] DOB: 03-25-1955    ADMISSION DATE:  02/19/2015 CONSULTATION DATE:  02/19/2015  REFERRING MD: IR  CHIEF COMPLAINT:  Rt side weakness, code stroke  INITIAL PRESENTATION:  59 yo wf , former smoker, recently moved to [place], hx of stroke with some residual rt side weakness. Boyfriend Noted abrupt onset of rt side weakness, left gaze preference and code stroke was activated. MCA clot retrieval per IR 0700 11/30. Sedated on vent. Very little information available for PMH/  Sedated and with NMB on board. PCCM asked to manage vent. Note some question of vaginal bleeding, ? From femoral artery cannulation. We will monitor closely in ICU.  STUDIES:  TTE 11/30:  EF 60-65%. Normal wall motion. Grade 1 diastolic function. PASP 92mmHg. RV normal in size & function. No mass seen. MRI Brain 12/1: Evolving L MCA CVA w/ patchy infarcts right frontal, parietal, & occipital. Scattered bilateral cerebella infarcts. Stable hematomas left temporal & occipital w/ small hemorrhage right parietal stable. TEE:  Pending  CULTURES: None  ANTIBIOTICS: None  SIGNIFICANT EVENTS: 11/30 code stroke 11/30 IR for MCA thrombus  LINES/TUBES: 11/30 OETT>> 11/30 left fem a line>> 11/30 Foley>>  SUBJECTIVE:  Awake and interactive this AM.  VITAL SIGNS: BP 141/70 mmHg  Pulse 85  Temp(Src) 98.7 F (37.1 C) (Oral)  Resp 7  Ht 5\' 4"  (1.626 m)  Wt 63.05 kg (139 lb)  BMI 23.85 kg/m2  SpO2 100%  HEMODYNAMICS:   VENTILATOR SETTINGS: Vent Mode:  [-] PSV FiO2 (%):  [40 %] 40 % PEEP:  [5 cmH20] 5 cmH20 Pressure Support:  [5 cmH20] 5 cmH20  INTAKE / OUTPUT: I/O last 3 completed shifts: In: 2625 [I.V.:2625] Out: 2345 [Urine:2345]  PHYSICAL EXAMINATION: General:  Awake. No acute distress. Following commands on the left.  Integument:  Warm & dry. No rash on exposed skin. Cyanosis of digit tips. Head: [place]/AT. EENT:  No scleral  injection or icterus. Endotracheal tube in place. PERRL. Cardiovascular:  Regular rate. No edema. No appreciable JVD.  Pulmonary:  Good aeration & clear to auscultation bilaterally. Symmetric chest wall rise on ventilator. Abdomen: Soft. Normal bowel sounds. Nondistended. Neurological: Eyes open. Following commands on the left not right.  LABS:  CBC  Recent Labs Lab 02/20/15 0655 02/21/15 0314 02/22/15 0348  WBC 10.4 11.7* 12.4*  HGB 10.0* 9.9* 9.0*  HCT 31.0* 31.5* 27.7*  PLT 209 235 305   Coag's  Recent Labs Lab 02/19/15 0404  APTT 25  INR 1.11   BMET  Recent Labs Lab 02/20/15 0330 02/21/15 0314 02/22/15 0348  NA 140 140 138  K 3.5 3.5 3.5  CL 105 103 102  CO2 26 26 26   BUN 6 6 7   CREATININE 0.72 0.71 0.66  GLUCOSE 132* 120* 104*   Electrolytes  Recent Labs Lab 02/20/15 0330 02/21/15 0314 02/22/15 0348  CALCIUM 9.0 9.1 9.1  MG 1.8 2.0 1.9  PHOS 4.8* 3.5 3.9   Sepsis Markers No results for input(s): LATICACIDVEN, PROCALCITON, O2SATVEN in the last 168 hours. ABG  Recent Labs Lab 02/19/15 0858 02/19/15 1647  PHART 7.492* 7.423  PCO2ART 31.4* 41.1  PO2ART 143.0* 352.0*   Liver Enzymes  Recent Labs Lab 02/19/15 0404 02/21/15 0314 02/22/15 0348  AST 23  --   --   ALT 16  --   --   ALKPHOS 100  --   --   BILITOT 0.4  --   --  ALBUMIN 3.9 3.1* 2.7*   Cardiac Enzymes No results for input(s): TROPONINI, PROBNP in the last 168 hours. Glucose  Recent Labs Lab 02/21/15 1632 02/21/15 1935 02/21/15 2335 02/21/15 2338 02/22/15 0329 02/22/15 0744  GLUCAP 96 108* 66 98 90 91    Imaging I reviewed CXR myself, no acute disease.  ASSESSMENT / PLAN:  PULMONARY A: Acute Hypoxic Respiratory Failure H/O tobacco abuse   P:   May transfer out of the ICU Monitor for airway protection. Titrate O2 for sat of 88-92%.  CARDIOVASCULAR A:  Possible Cardiac Mass H/O HTN Goal SBP per IR & Neurology Peripheral Cyanosis  P:  BP  management by Neurology/IR Monitoring on telemetry Echo noted. Path consistent with blood clot.  RENAL A:   Urinary retention  P:   Continue foley catheter Monitoring UOP with foley Trending renal function daily with BUN/Creatinine Replace electrolytes as indicated.  GASTROINTESTINAL A:   GI protection  P:   Protonix IV daily Holding on tube feeds for extubation  HEMATOLOGIC A:   S/P Left MCA clot retrieval Questionable vaginal bleeding Anemia - Hgb stable. No further bleeding post tPA.  P:  Activase per neuro Trending Hgb daily with CBC SCDs Chemical DVT prophylaxis when ok with Neurology/IR  INFECTIOUS A:   No acute issue  P:   Monitor for leukocytosis & fever  ENDOCRINE A:   Hyperglycemia - No h/o DM.  P:   Accu-Checks q4hr Low dose SSI algorithm  NEUROLOGIC A:   Left MCA CVA - Code stroke 11/30 @ 5am. S/P IR clot retrieval. H/O Ischemic stroke 11/2013 w/ residual right weakness  P:   Minimize sedation. Monitor airway protection. Further management per Neurology  FAMILY  - Updates: No family at bedside  - Inter-disciplinary family meet or Palliative Care meeting due by:  12/7  Discussed with neurology MD and bedside RN.  Ok to transfer out of the ICU, PCCM will sign off, please call back if needed.  Rush Farmer, M.D. [facility] Pulmonary/Critical Care Medicine. Pager: [phone]. After hours pager: [phone].

## 2015-02-22 NOTE — Progress Notes (Addendum)
Received from ICU via bed; transfer into 5M01; oriented patient to room and unit routine; patient following commands.

## 2015-02-22 NOTE — Evaluation (Signed)
Physical Therapy Evaluation Patient Details Name: Erin Galloway MRN: [id number] DOB: 10-15-1955 Today's Date: 02/22/2015   History of Present Illness  Erin Galloway is an 59 y.o. 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  Post tPA hemorrhage Rt parietal and Lt temporal and occipital lobes.  CT on 12/1 and 12/2 demonstrated stable Lt hemorrhages and decreasing Rt parietal hemorrhage.    Clinical Impression  Pt admitted with above diagnosis. Pt currently with functional limitations due to the deficits listed below (see PT Problem List). Unable to gather information from pt as she presents w/ expressive and receptive aphasia and is unable to verbalize.  Blinks to respond "yes" but is inconsistent following commands.  Max A for sit<>stand and stand pivot transfer to chair. Pt will benefit from skilled PT to increase their independence and safety with mobility to allow discharge to the venue listed below.      Follow Up Recommendations CIR;Supervision/Assistance - 24 hour    Equipment Recommendations  Other (comment) (TBD)    Recommendations for Other Services OT consult;Rehab consult     Precautions / Restrictions Precautions Precautions: Fall Restrictions Weight Bearing Restrictions: No      Mobility  Bed Mobility Overal bed mobility: Needs Assistance Bed Mobility: Rolling;Sidelying to Sit Rolling: Mod assist Sidelying to sit: Max assist       General bed mobility comments: A to manage Bil LEs and A to trunk to push up to sitting.  Verbal and tactile cues provided, pt able to initiate rolling.  Transfers Overall  transfer level: Needs assistance Equipment used: 1 person hand held assist Transfers: Sit to/from [facility] Sit to Stand: Max assist;+2 safety/equipment Stand pivot transfers: Max assist;+2 safety/equipment       General transfer comment: Use of gait belt and blocking Rt knee during transfers to [facility] and then to recliner chair.  Pt WB through Rt LE but needs full assist to scoot during pivot.  Pt shuffles Lt foot to pivot.  Ambulation/Gait                Stairs            Wheelchair Mobility    Modified Rankin (Stroke Patients Only) Modified Rankin (Stroke Patients Only) Pre-Morbid Rankin Score: No significant disability (slight Rt sided residual weakness from previous stroke) Modified Rankin: Severe disability     Balance Overall balance assessment: Needs assistance Sitting-balance support: Feet supported;Single extremity supported Sitting balance-Leahy Scale: Fair Sitting balance - Comments: Pt able to sit EOB for ~5 mins w/ close min guard assist. Postural control: Right lateral lean Standing balance support: During functional activity;Single extremity supported Standing balance-Leahy Scale: Zero                               Pertinent Vitals/Pain Pain Assessment: No/denies pain    Home Living Family/patient expects to be discharged to:: Inpatient rehab                 Additional Comments: Pt unable to provide history 2/2 expressive and receptive aphasia    Prior Function           Comments:  Pt unable to provide history 2/2 expressive and receptive aphasia     Hand Dominance        Extremity/Trunk Assessment   Upper Extremity Assessment: Defer to OT evaluation           Lower Extremity Assessment: RLE deficits/detail;LLE deficits/detail;Difficult to assess due to impaired cognition RLE Deficits / Details: Pt does not fuctionally use Rt LE during mobility LLE Deficits / Details: Pt able to  pivot/shuffle on Lt LE during stand pivot     Communication   Communication: Receptive difficulties;Expressive difficulties (pt able to follow some commands (see cognitive notes below))  Cognition Arousal/Alertness: Awake/alert Behavior During Therapy: Flat affect Overall Cognitive Status: Impaired/Different from baseline Area of Impairment: Following commands;Problem solving;Safety/judgement       Following Commands: Follows one step commands inconsistently Safety/Judgement: Decreased awareness of safety;Decreased awareness of deficits   Problem Solving: Slow processing;Decreased initiation;Difficulty sequencing;Requires verbal cues;Requires tactile cues General Comments: Pt able to follow commands inconsistently.  Demonstrates ability to smile, blink her eyes.  But creates fist w/ Lt hand in response to most commands.  Able to blink to answer yes to questions at end of session such as, "close your eyes tight if you are comfortable"    General Comments      Exercises        Assessment/Plan    PT Assessment Patient needs continued PT services  PT Diagnosis Difficulty walking;Hemiplegia dominant side;Hemiplegia non-dominant side;Altered mental status (unclear dominant side)   PT Problem List Decreased strength;Decreased range of motion;Decreased activity tolerance;Decreased balance;Decreased mobility;Decreased cognition;Decreased knowledge of use of DME;Decreased safety awareness;Decreased knowledge of precautions  PT Treatment Interventions DME instruction;Gait training;Functional mobility training;Therapeutic activities;Therapeutic exercise;Balance training;Neuromuscular re-education;Cognitive remediation;Patient/family education;Wheelchair mobility training   PT Goals (Current goals can be found in the Care Plan section) Acute Rehab PT Goals Patient Stated Goal: unable to state PT Goal Formulation: Patient unable to participate in goal setting Time For Goal Achievement:  03/15/15 Potential to Achieve Goals: Good    Frequency Min 4X/week   Barriers to discharge        Co-evaluation               End of Session Equipment Utilized During Treatment: Gait belt;Oxygen Activity Tolerance: Patient limited by lethargy;Patient limited by fatigue;Patient tolerated treatment well Patient left: in chair;with call bell/phone within reach;with chair alarm set Nurse Communication: Mobility status         Time: [id number] PT Time Calculation (min) (ACUTE ONLY): 30 min   Charges:   PT Evaluation $Initial PT Evaluation Tier I: 1 Procedure PT Treatments $Therapeutic Activity: 8-22 mins   PT G Codes:       Joslyn Hy PT, DPT [phone] Pager: [phone] 02/22/2015, 12:05 PM

## 2015-02-22 NOTE — Consult Note (Signed)
[address]       [place] [address]             [phone]        Erin Galloway Medical Record [id number] Date of Birth: 12-06-1955  Referring: Dr. Leonie Man Primary Care: No PCP Per Patient  Chief Complaint:    Chief Complaint  Patient presents with  . Stroke-right hemiparesis, aphasia left gaze preference Papillary fibroelastoma   History of Present Illness:     This is a 59 year old Caucasian female who has a history of hypertension, tobacco abuse, and ischemic stroke (September 2016). She was brought to [facility] ER by EMS early on 02/19/2015. According to medical records, as patient aphasic since stroke, boyfriend said they were talking in bed and she suddenly became confused, unable to speak and move her right side, and was constantly looking to the left. CT of head done showed no acute abnormality, but remote infarction in the posterior frontal lobe, right parietal occipital region, and possibly a smaller area of remote infarction in the left occipital lobe. CTA of brain showed thrombus M1 segment of the left MCA with poor collaterals. She was admitted by neurology. IV TPA was given and then Dr. Estanislado Pandy (in IR) performed TICI3 revascularization of the L M1 with trevo and 2.5 mg IA tPA.on 11/30. She had some vaginal bleeding afterward but she is likely post menopausal. Patient was intubated for respiratory failure. Dr. Nelda Marseille managed vent.      Of note, the boyfriend states patient had a left frontal infarct 6 weeks ago in [place] ,[place].  She had right hemiparesis. During the work up for stroke, it was revealed  that she had a  mitral valve lesion. Initially, she was anticoagulated with Lovenox for 1 week and was subsequently changed to aspirin. Cardiac surgery was discussed with patient but she apparently refused surgery. Patient recovered quite well, but had mild residual right-sided weakness. Cardiology was consulted. Echo done 11/30  showed LVEF 60-65%, wall motion normal grade 1 diastolic dysfunction, no AS or AI, mild MR, no pericardial effusion. TEE done 12/1 showed there is a shaggy 0.6 x 0.5 cm non-pedunculated mass on the tip of the anterior mitral leaflet (which partially prolapses), suggestive of possible fibroelastoma; however, consider vegetation or marantic (Libman-Sacks) endocarditis as well. There is mild regurgitation. As a result, consultation was obtained with Dr. Roxy Manns. Currently, patient is no acute distress. She is sitting in chair awake, aphasic, right hemiparesis.   Current Activity/ Functional Status: Patient was independent with mobility/ambulation, transfers, ADL's, IADL's.Now aphasic, left gaze preference, awake, right hemiparesis   Zubrod Score: At the time of surgery, this patient's most appropriate activity status/level should be described as: []     0    Normal activity, no symptoms []     1    Restricted in physical strenuous activity but ambulatory, able to do out light work []     2    Ambulatory and capable of self care, unable to do work activities, up and about                 more than 50%  Of the time                            []     3    Only limited self care, in bed greater than 50% of waking hours [x]   4    Completely disabled, no self care, confined to bed or chair []     5    Moribund  Past Medical History  Diagnosis Date  . Stroke [facility]) 2016    September   . Hypertension     Past Surgical History  Procedure Laterality Date  . Induced abortion      Two abortions   . Radiology with anesthesia N/A 02/19/2015    Procedure: RADIOLOGY WITH ANESTHESIA;  Surgeon: Luanne Bras, MD;  Location: [place];  Service: Radiology;  Laterality: N/A;     History  Alcohol Use No    Social History   Social History  . Marital Status: Single    Spouse Name: Currently, has boyfrien  . Number of Children: 2  . Years of Education: N/A    Social History Main Topics  . Smoking status:  Former Research scientist (life sciences)  . Smokeless tobacco: Never Used  . Alcohol Use: No  . Drug Use: No  . Sexual Activity: Not on file   Allergies: No Known Allergies  Current Facility-Administered Medications  Medication Dose Route Frequency Provider Last Rate Last Dose  . 0.9 %  sodium chloride infusion   Intravenous Continuous Amie Portland, MD 75 mL/hr at 02/22/15 0813    . acetaminophen (TYLENOL) tablet 650 mg  650 mg Oral Q4H PRN Amie Portland, MD       Or  . acetaminophen (TYLENOL) suppository 650 mg  650 mg Rectal Q4H PRN Amie Portland, MD      . acetaminophen (TYLENOL) tablet 1,000 mg  1,000 mg Oral Q6H PRN Luanne Bras, MD       Or  . acetaminophen (TYLENOL) suppository 650 mg  650 mg Rectal Q6H PRN Luanne Bras, MD   650 mg at 02/22/15 0045  . antiseptic oral rinse (CPC / CETYLPYRIDINIUM CHLORIDE 0.05%) solution 7 mL  7 mL Mouth Rinse q12n4p Garvin Fila, MD      . chlorhexidine (PERIDEX) 0.12 % solution 15 mL  15 mL Mouth Rinse BID Garvin Fila, MD   15 mL at 02/22/15 1059  . enoxaparin (LOVENOX) injection 40 mg  40 mg Subcutaneous Daily Donzetta Starch, NP   40 mg at 02/22/15 1100  . fentaNYL (SUBLIMAZE) injection 25-50 mcg  25-50 mcg Intravenous Q1H PRN Javier Glazier, MD   50 mcg at 02/20/15 1610  . insulin aspart (novoLOG) injection 0-9 Units  0-9 Units Subcutaneous 6 times per day Javier Glazier, MD   0 Units at 02/20/15 1406  . labetalol (NORMODYNE,TRANDATE) injection 10 mg  10 mg Intravenous Q10 min PRN Amie Portland, MD      . midazolam (VERSED) injection 1-2 mg  1-2 mg Intravenous Q1H PRN Javier Glazier, MD      . nicardipine (CARDENE) 40mg  in 0.83% saline 279ml IV DOUBLE STRENGTH infusion (0.2 mg/ml)  5-15 mg/hr Intravenous Continuous Javier Glazier, MD   Stopped at 02/20/15 0900  . ondansetron (ZOFRAN) injection 4 mg  4 mg Intravenous Q6H PRN Luanne Bras, MD      . pantoprazole (PROTONIX) injection 40 mg  40 mg Intravenous QHS Amie Portland,  MD   40 mg at 02/21/15 2229  . senna-docusate (Senokot-S) tablet 1 tablet  1 tablet Oral QHS PRN Amie Portland, MD        Prescriptions prior to admission  Medication Sig Dispense Refill Last Dose  . amLODipine (NORVASC) 5 MG tablet Take 5 mg by mouth  daily.   02/18/2015 at Unknown time  . aspirin-acetaminophen-caffeine (EXCEDRIN MIGRAINE) 250-250-65 MG tablet Take 1 tablet by mouth every 6 (six) hours as needed for headache.   Past Week at Unknown time  . atorvastatin (LIPITOR) 20 MG tablet Take 20 mg by mouth daily.   02/18/2015 at Unknown time   Family History: No family history on file.   Review of Systems:  Unable to obtain as patient aphasic     Physical Exam: BP 141/70 mmHg  Pulse 85  Temp(Src) 98.7 F (37.1 C) (Oral)  Resp 7  Ht 5\' 4"  (1.626 m)  Wt 139 lb (63.05 kg)  BMI 23.85 kg/m2  SpO2 100%   General appearance: Awake, sitting in chair, in no acute distress Head: Normocephalic, without obvious abnormality, atraumatic Neck: no adenopathy, no carotid bruit, no JVD and supple, symmetrical, trachea midline Resp: clear to auscultation bilaterally Cardio: regular rate and rhythm, S1, S2 normal, no murmur, click, rub or gallop GI: soft, non-tender; bowel sounds normal; no masses,  no organomegaly Extremities: No lower extremity edema. Cyanosis of finger tips Neurologic: Eyes open, aphasic, right hemiparesis. Was able to squeeze my hand with her left hand and move left foot  Diagnostic Studies & Laboratory data:     Recent Radiology Findings:   Ct Head Wo Contrast  02/21/2015  CLINICAL DATA:  Followup stroke/tPA. EXAM: CT HEAD WITHOUT CONTRAST TECHNIQUE: Contiguous axial images were obtained from the base of the skull through the vertex without intravenous contrast. COMPARISON:  MRI of the brain February 20, 2015 and CT head February 20, 2015 FINDINGS: Two similar LEFT temporal occipital lobe intraparenchymal hematomas measuring up to 2 cm with surrounding low-density  vasogenic edema. In addition, wedge-like hypodensity LEFT frontotemporal parietal lobe corresponding to known infarct. Patchy LEFT basal ganglia involvement. Local sulcal effacement. No significant midline shift. Patchy wedge-like hypodensities RIGHT cerebrum with faint hemorrhage. In addition, faint linear cortical density LEFT frontal gyrus. Small bilateral cerebellar infarcts better seen on prior MRI. No abnormal extra-axial fluid collections. Basal cisterns are patent. Mild calcific atherosclerosis of the carotid siphons. Ocular globes and orbital contents are unremarkable. Minimal LEFT sphenoid mucosal thickening. Minimal bilateral mastoid air cell soft tissue opacification. No skull fracture. IMPRESSION: Evolving large LEFT MCA territory infarct with petechial hemorrhage. Two similar hematomas within the LEFT temporal occipital lobe. Multifocal small areas of acute ischemia and petechial hemorrhage RIGHT cerebrum. Small cerebellar infarcts better seen on prior MRI. Electronically Signed   By: Elon Alas M.D.   On: 02/21/2015 04:13     I have independently reviewed the above radiologic studies.  Recent Lab Findings: Lab Results  Component Value Date   WBC 12.4* 02/22/2015   HGB 9.0* 02/22/2015   HCT 27.7* 02/22/2015   PLT 305 02/22/2015   GLUCOSE 104* 02/22/2015   CHOL 216* 02/20/2015   TRIG 187* 02/22/2015   HDL 27* 02/20/2015   LDLCALC UNABLE TO CALCULATE IF TRIGLYCERIDE OVER 400 mg/dL 02/20/2015   ALT 16 02/19/2015   AST 23 02/19/2015   NA 138 02/22/2015   K 3.5 02/22/2015   CL 102 02/22/2015   CREATININE 0.66 02/22/2015   BUN 7 02/22/2015   CO2 26 02/22/2015   INR 1.11 02/19/2015   HGBA1C 6.0* 02/21/2015    Assessment / Plan:      1. Stroke- in setting of left anterior frontal infarct 11/2014 with likely unrealized R parietal infarct since that admission. Dominant left MCA infarct s/p IV tPA and TICI3 revascularizaion of L M1 with  mechanical embolectomy and IA tPA.  Current infarct(s) felt to be embolic secondary to cardiac mechanism. Pt with post tPA hemorrhage R parietal. 2. Acute respiratory failure-extubated yesterday afternoon. On 3 liters of oxygen via [place] this am 3. Papilary fibroelastoma mitral valve. According to medical records, was told in [place] state that she needed mitral valve surgery, but patient did not have and moved out of state. 4.Hypertension-previously on Cardene drip.  I  spent 15 minutes counseling the patient face to face and 50% or more the  time was spent in counseling and coordination of care. The total time spent in the appointment was 60 minutes.   Erin Pinks PA-C 02/22/2015 11:16 AM    I have seen and examined the patient and agree with the assessment and plan as outlined.   The patient has been extubated uneventfully and appears to be more awake and alert, although she has dense expressive aphasia and severe right sided paralysis.  I have personally reviewed the patient's transesophageal echocardiogram, CT and MRI scans reviewed previously with Dr. Leonie Man.  I agree that the small mass that remains on the mitral valve has echocardiographic features consistent with likely papillary fibro-elastoma. Surgical pathology from material extracted from left MCA by Dr. Estanislado Pandy was found to be consistent with organized blood clot with no tumor seen.  At this point the patient is not a candidate for surgery due to the massive size of her most recent stroke with associated hemorrhagic transformation.  She clearly remains at risk for recurrent embolic events during the interim period of time.  Whether or not she should be anticoagulated using warfarin should be left to the discretion of Dr. Leonie Man and colleagues because of concerns related to the possibility of further bleeding in the immediate future.  Whether or not it might be reasonable to consider elective mitral valve repair for resection of the residual mass on her mitral valve in  the future will depend upon her subsequent clinical recovery.  Prognosis remains guarded at this time.  I have attempted to telephone the patient's friend, Mr. Everlena Cooper, but he was not available.  Will follow intermittently.  Please call should specific questions arise.   I spent in excess of 60 minutes during the conduct of this hospital encounter and >50% of this time involved direct face-to-face encounter with the patient for counseling and/or coordination of their care.   Erin Alberts, MD 02/22/2015 2:18 PM

## 2015-02-22 NOTE — Progress Notes (Signed)
STROKE TEAM PROGRESS NOTE   SUBJECTIVE (INTERVAL HISTORY) Patient awake, alert and sitting in chair. No family/friends present. Did not pass swallow, will continue NPO and IVF. Will do barium study tomorrow, if did not pass swallow, will need tube feeding. Repeat CT in am.   OBJECTIVE Temp:  [98.2 F (36.8 C)-99.9 F (37.7 C)] 98.2 F (36.8 C) (12/03 0400) Pulse Rate:  [86-118] 96 (12/03 0500) Cardiac Rhythm:  [-] Sinus tachycardia (12/02 2000) Resp:  [13-21] 18 (12/03 0600) BP: (119-170)/(61-90) 139/85 mmHg (12/03 0600) SpO2:  [92 %-100 %] 98 % (12/03 0600) FiO2 (%):  [40 %] 40 % (12/02 1148)  CBC:   Recent Labs Lab 02/21/15 0314 02/22/15 0348  WBC 11.7* 12.4*  NEUTROABS 7.9* 8.3*  HGB 9.9* 9.0*  HCT 31.5* 27.7*  MCV 85.6 83.9  PLT 235 [id number]    Basic Metabolic Panel:   Recent Labs Lab 02/21/15 0314 02/22/15 0348  NA 140 138  K 3.5 3.5  CL 103 102  CO2 26 26  GLUCOSE 120* 104*  BUN 6 7  CREATININE 0.71 0.66  CALCIUM 9.1 9.1  MG 2.0 1.9  PHOS 3.5 3.9    Lipid Panel:     Component Value Date/Time   CHOL 216* 02/20/2015 0330   TRIG 187* 02/22/2015 0348   HDL 27* 02/20/2015 0330   CHOLHDL 8.0 02/20/2015 0330   VLDL UNABLE TO CALCULATE IF TRIGLYCERIDE OVER 400 mg/dL 02/20/2015 0330   LDLCALC UNABLE TO CALCULATE IF TRIGLYCERIDE OVER 400 mg/dL 02/20/2015 0330   HgbA1c:  Lab Results  Component Value Date   HGBA1C 6.0* 02/21/2015   Urine Drug Screen: No results found for: LABOPIA, COCAINSCRNUR, LABBENZ, AMPHETMU, THCU, LABBARB    IMAGING I have personally reviewed the radiological images below and agree with the radiology interpretations.  Ct Head Wo Contrast 02/21/2015 Evolving large LEFT MCA territory infarct with petechial hemorrhage. Two similar hematomas within the LEFT temporal occipital lobe. Multifocal small areas of acute ischemia and petechial hemorrhage, RIGHT cerebrum. Small cerebellar infarcts better seen on prior MRI. 02/20/2015 1. Evolving  large MCA territory infarct and additional patchy right cerebral infarcts. Findings are better delineated on MRI of the brain performed on the same day. 2. Stable posterior left temporal and occipital hemorrhages. 3. Decreased conspicuity of small volume right parietal hemorrhage. 02/19/2015 1656 1. Stable appearance of posterior left temporal hemorrhages. 2. Evolving large left MCA territory infarct. 3. Densities in the right frontal and parietal lobes likely represents evolving infarcts with decreasing parenchymal enhancement. 4. Small right parietal hemorrhage is stable. 02/19/2015 0907 1. Hemorrhagic conversion in the left occipital lobe infarcts with two hematomas up to 22 mm diameter. 2. Multi focal bilateral cerebral convexity high density is likely enhancement from subacute infarct. Pattern suggests recurrent central embolic disease.  02/19/2015 0428 Remote infarctions with encephalomalacia. Generalized atrophy and chronic white matter changes consistent with small vessel disease. No acute intracranial findings.   CTA NECK  02/19/2015 1. Atheromatous plaque about the left carotid bifurcation/proximal left ICA with associated short segment stenosis of approximately 40-50% by NASCET criteria. 2. Mild atheromatous plaque about the right carotid bifurcation/proximal right ICA without hemodynamically significant stenosis. 3. 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 The dominant right vertebral artery is widely patent along its entire course.   CTA HEAD  02/19/2015 1. Short segment focal defect within the mid left M1 segment, worrisome for nonocclusive thrombus. There may be a superimposed stenosis at this level  as well. Flow is present within the left M1 segment distally as well as the proximal M2 branches, however, the MCA branches are markedly attenuated and possibly occluded distally. 2. Atheromatous disease within the cavernous  ICAs with associated mild to moderate multi focal irregularity and stenosis. 3. Irregularity with multi focal moderate to severe narrowing of the left V4 segment. Dominant right vertebral artery widely patent.   Cerebral Angiogram 02/19/2015 S/P Lt common carotid arteriogram,followed by complete revascularization of occluded LT LMCA M 1 With x1 pass with 77mm x 30 mm Trevoprovue retrieval device and 2.5 mg of superselective intracranial TPA  TICI 3 flow restored  MRI head 02/20/2015 1. Evolving large confluent left MCA territory infarct, with additional patchy infarcts within the right frontal, parietal, and occipital regions, with additional small scattered bilateral cerebellar infarcts. 2. Stable hematomas within the left temporal and occipital lobes, consistent with hemorrhagic transformation. Small volume hemorrhage in the right parietal region also stable.  Chest Xray 02/20/2015 Patchy density peripherally in the left lower lung likely reflecting atelectasis or early pneumonia. The endotracheal tube is in reasonable position radiographically.  2D echo  - Left ventricle: The cavity size was normal. Wall thickness wasincreased in a pattern of mild LVH. Systolic function was normal.The estimated ejection fraction was in the range of 60% to 65%.Wall motion was normal; there were no regional wall motionabnormalities. Doppler parameters are consistent with abnormalleft ventricular relaxation (grade 1 diastolic dysfunction). - Mitral valve: There was mild regurgitation. - Pulmonary arteries: Systolic pressure was moderately increased.PA peak pressure: 44 mm Hg (S).  TEE 0.6 x 0.5 cm mitral valve mass on the tip of the anteriorleaflet, suggestive of papillary fibroelastoma - vegetation,thrombus or Libman-Sacks endocarditis are less likely findings. The mitral valve is notable for a shaggy, non-pedunculated round mass noted at the tip of the anterior mitral leaflet (it measures about 0.5 cm x  0.6 cm). This is on the atrial side of the valve and may prolapse across the valve plane. It was visualized in 2D and 3D views. There is Mild regurgitation. There is no stenosis.   PHYSICAL EXAM  Temp:  [98.2 F (36.8 C)-99.9 F (37.7 C)] 98.3 F (36.8 C) (12/03 1200) Pulse Rate:  [77-118] 115 (12/03 1500) Resp:  [7-21] 18 (12/03 1500) BP: (119-163)/(61-90) 163/81 mmHg (12/03 1500) SpO2:  [85 %-100 %] 90 % (12/03 1500)  General - Well nourished, well developed, in no apparent distress.  Ophthalmologic - Fundi not visualized due to noncooperation.  Cardiovascular - Regular rate and rhythm.  Neuro - awake, alert, but aphasic, no speech outpt, able to intermittently follow some simple commands, but inconsistent. Not able to test naming or repeating. Right side neglect and left eye preference. PERRL, eyes able to cross midline. Right facial droop, LUE and LLE 4/5 and RUE and RLE withdraw to pain. DTR 1+ and right babinski positive.   ASSESSMENT/PLAN Ms. Erin Galloway is a 59 y.o. female with history of HTN and a stroke 6 weeks ago due to MV abnormality for which she refused surgery presenting with right hemiparesis, aphasia and L gaze preference. She received IV t-PA 02/19/2015 at 0436 and complete revascularization of L M1 with trevo and IA tPA.   Stroke: large left MCA large infarcts, patchy R frontal, parietal and occipital infarcts and bilateral cerebellar infarcts s/p IV tPA and TICI3 revascularizaion of L M1 with mechanical embolectomy and IA tPA.  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  Hemorrhagic transformation at R parietal, L temorporal and occipital lobes.  Resultant aphasia, right hemiparesis, right neglect and L gaze preference  Post IR CT with L occipital hemorrhagic transformation.  MRI left MCA territory infarcts, patchy R frontal, parietal and occipital infarcts and bilateral cerebellar infarcts    repeat CT head 12/1 and 12/2 stable - Left hemorrhagic transformation, decreasing R parietal hemorrhage  2D Echo No SOE reported. Upon review with cardiology, MV abnormality present.   TEE - 0.6x0.5 mitral valve mass on the anterior leaflet suggestive of papillary fibroelastoma  LDL unable to calculate, TG 450  HgbA1c 6.4  Lovenox 40 mg sq daily VTE prophylaxis.   Diet NPO time specified .  No antithrombotic prior to admission, no aspirin at this time due to hemorrhage.  Per boyfriend, pt had a stroke 6 weeks ago in [place] state thought to be related to a growth on her MV for which the doctors recommended surgery, which pt refused. Was treated with lovenox x 1 week, then stopped.  Consulted cardiothoracic surgery consult  - agrees likely papillary fibroblastoma, pt not a surgical candidate at this time given large stroke, can reconsider when she recovers from stroke  Therapy recommendations: pending   Disposition: pending   Subacute embolic infarct in [id number]  Per boyfriend, pt had a stroke 6 weeks ago in [place] state thought to be related to a growth on her MV for which the doctors recommended surgery, which pt refused. Was treated with lovenox x 1 week, then stopped.  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  Hypercoagulable and other autoimmune work up pending  Hypertension  Stable  On labetolol iv PRN   Hyperlipidemia  Home meds: lipitor 20  LDL unable to calculate, goal < 70  Resume statin once able to swallow or has a feeding tube  Plan continue statin at discharge  Other  Stroke Risk Factors  Hx stroke/TIA  Hospital day # 3  This patient is critically ill and at significant risk of neurological worsening, death and care requires constant monitoring of vital signs, hemodynamics,respiratory and cardiac monitoring, extensive review of multiple databases, frequent neurological assessment, discussion with family, other specialists and medical decision making of high complexity.I have made any additions or clarifications directly to the above note.This critical care time does not reflect procedure time, or teaching time or supervisory time of PA/NP/Med Resident etc but could involve care discussion time. I spent 35 minutes of neurocritical care time  in the care of  this patient.  Rosalin Hawking, MD PhD Stroke Neurology 02/22/2015 4:04 PM   To contact Stroke Continuity provider, please refer to [email or web address] After hours, contact General Neurology

## 2015-02-23 ENCOUNTER — Inpatient Hospital Stay (HOSPITAL_COMMUNITY): Payer: Medicaid - Out of State

## 2015-02-23 DIAGNOSIS — G936 Cerebral edema: Secondary | ICD-10-CM

## 2015-02-23 LAB — CBC
HCT: 29.2 % — ABNORMAL LOW (ref 36.0–46.0)
Hemoglobin: 9.4 g/dL — ABNORMAL LOW (ref 12.0–15.0)
MCH: 26.8 pg (ref 26.0–34.0)
MCHC: 32.2 g/dL (ref 30.0–36.0)
MCV: 83.2 fL (ref 78.0–100.0)
PLATELETS: 399 10*3/uL (ref 150–400)
RBC: 3.51 MIL/uL — AB (ref 3.87–5.11)
RDW: 13.5 % (ref 11.5–15.5)
WBC: 11.8 10*3/uL — AB (ref 4.0–10.5)

## 2015-02-23 LAB — BASIC METABOLIC PANEL
ANION GAP: 12 (ref 5–15)
BUN: 7 mg/dL (ref 6–20)
CALCIUM: 9.2 mg/dL (ref 8.9–10.3)
CO2: 25 mmol/L (ref 22–32)
Chloride: 100 mmol/L — ABNORMAL LOW (ref 101–111)
Creatinine, Ser: 0.7 mg/dL (ref 0.44–1.00)
GFR calc Af Amer: >60 mL/min (ref >60)
GLUCOSE: 96 mg/dL (ref 65–99)
POTASSIUM: 3.1 mmol/L — AB (ref 3.5–5.1)
SODIUM: 137 mmol/L (ref 135–145)

## 2015-02-23 LAB — GLUCOSE, CAPILLARY
GLUCOSE-CAPILLARY: 97 mg/dL (ref 65–99)
GLUCOSE-CAPILLARY: 97 mg/dL (ref 65–99)
Glucose-Capillary: 94 mg/dL (ref 65–99)

## 2015-02-23 MED ORDER — WHITE PETROLATUM GEL
Status: AC
  Administered 2015-02-23: 0.2
  Filled 2015-02-23: qty 1

## 2015-02-23 NOTE — Evaluation (Signed)
Occupational Therapy Evaluation Patient Details Name: Erin Galloway MRN: [id number] DOB: 1955/06/07 Today's Date: 02/23/2015    History of Present Illness [name] is an 59 y.o. 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  Post tPA hemorrhage Rt parietal and Lt temporal and occipital lobes.  CT on 12/1 and 12/2 demonstrated stable Lt hemorrhages and decreasing Rt parietal hemorrhage.     Clinical Impression   This 59 yo female admitted with above presents to acute OT with decreased use of right side, decreased balance, decreased mobility, decreased attention to right side, vision deficits to be further assessed as treatment progresses all affecting pt's ability to care for herself. She will benefit from acute OT with follow up OT on CIR to get back to as independent as possible and decrease burden of caregiver(s).    Follow Up Recommendations  CIR    Equipment Recommendations   (TBD next venue)       Precautions / Restrictions Precautions Precautions: Fall Restrictions Weight Bearing Restrictions: No      Mobility Bed Mobility Overal bed mobility: Needs Assistance Bed Mobility: Rolling;Sidelying to Sit Rolling: Max assist (to right with verbal and tactile cues for sequencing) Sidelying to sit: Max assist       General bed mobility comments: A to manage Bil LEs and A to trunk to push up to sitting.   Transfers Overall transfer level: Needs assistance Equipment used:  (1 person A standing in front of her using gait belt) Transfers: Sit to/from [employer] Transfers Sit to Stand: Max assist Stand pivot  transfers: Max assist       General transfer comment: Use of gait belt and blocking Rt knee during transfers to recliner going to pt's left    Balance Overall balance assessment: Needs assistance Sitting-balance support: Feet supported;Single extremity supported   Sitting balance - Comments: Pt moderately pushing with right UE to her left (enough to push her off balance Postural control: Right lateral lean Standing balance support: Single extremity supported;During functional activity Standing balance-Leahy Scale: Zero                              ADL Overall ADL's : Needs assistance/impaired                                       General ADL Comments: Pt currently total A for BADLs due to decreased use of right side and expressive/receptive difficulties     Vision Additional Comments: Pt able to track me left with head and eyes with cues to follow me. Can track right about 1/2 past midline with cues to look at me, look this way. Need to continue to asses vision in further sessions.          Pertinent Vitals/Pain Pain Assessment: Faces Faces Pain Scale: Hurts even more Pain Location: RUE when I checked nail bed pressure for pain response Pain Descriptors / Indicators: Other (Comment) (withdrawl of RUE) Pain Intervention(s):  (removed painful stimulus)     Hand Dominance  (unknown due to pt's expressive  and receptive difficulties)   Extremity/Trunk Assessment Upper Extremity Assessment Upper Extremity Assessment: RUE deficits/detail RUE Deficits / Details: No AROM noted on command, pt does withdraw to pain with RUE for nail bed pressure. PROM tightness noted in elbow flexion, wrist extension, and forearm supination RUE Coordination: decreased fine motor;decreased gross motor           Communication Communication Communication: Receptive difficulties;Expressive difficulties   Cognition Arousal/Alertness: Awake/alert Behavior During  Therapy: Flat affect Overall Cognitive Status: Impaired/Different from baseline Area of Impairment: Following commands;Safety/judgement;Problem solving       Following Commands: Follows one step commands inconsistently Safety/Judgement: Decreased awareness of safety;Decreased awareness of deficits   Problem Solving: Slow processing;Decreased initiation;Difficulty sequencing;Requires verbal cues;Requires tactile cues (requires gestural cues) General Comments: Pt able to follow commands inconsistently.  Demonstrates ability to smile, blink her eyes.  But creates fist w/ Lt hand in response to most commands.                Home Living Family/patient expects to be discharged to:: Inpatient rehab                                        Prior Functioning/Environment          Comments: Pt unable to provide history 2/2 expressive and receptive difficulties    OT Diagnosis: Generalized weakness;Cognitive deficits;Disturbance of vision;Hemiplegia dominant side   OT Problem List: Decreased strength;Decreased range of motion;Impaired balance (sitting and/or standing);Impaired UE functional use;Decreased cognition;Decreased safety awareness;Decreased coordination;Impaired vision/perception;Impaired tone;Decreased knowledge of use of DME or AE   OT Treatment/Interventions: Self-care/ADL training;Patient/family education;Balance training;Visual/perceptual remediation/compensation;Therapeutic exercise;Neuromuscular education;Therapeutic activities;DME and/or AE instruction;Cognitive remediation/compensation    OT Goals(Current goals can be found in the care plan section) Acute Rehab OT Goals Patient Stated Goal: unable to state due to expressive difficulties OT Goal Formulation: Patient unable to participate in goal setting Time For Goal Achievement: 03/09/15 Potential to Achieve Goals: Good  OT Frequency: Min 3X/week   Barriers to D/C:  (unknown)             End of  Session Equipment Utilized During Treatment: Gait belt Nurse Communication: Mobility status;Need for lift equipment (maxi move)  Activity Tolerance: Patient tolerated treatment well Patient left: in chair;with call bell/phone within reach;with chair alarm set   Time: [id number] OT Time Calculation (min): 26 min Charges:  OT General Charges $OT Visit: 1 Procedure OT Evaluation $Initial OT Evaluation Tier I: 1 Procedure OT Treatments $Therapeutic Activity: 8-22 mins  Almon Register [id number] 02/23/2015, 10:16 AM

## 2015-02-23 NOTE — Progress Notes (Signed)
STROKE TEAM PROGRESS NOTE   SUBJECTIVE (INTERVAL HISTORY) No family members present. The patient remains aphasic - expressive greater than receptive. She remains NPO. Barium swallow planned for tomorrow. Otherwise, patient may need a feeding tube.  OBJECTIVE Temp:  [97.5 F (36.4 C)-100.4 F (38 C)] 97.5 F (36.4 C) (12/04 0926) Pulse Rate:  [73-115] 73 (12/04 0926) Cardiac Rhythm:  [-] Sinus tachycardia (12/04 0900) Resp:  [16-20] 18 (12/04 0926) BP: (155-172)/(72-83) 163/79 mmHg (12/04 0926) SpO2:  [90 %-100 %] 93 % (12/04 0926)  CBC:   Recent Labs Lab 02/21/15 0314 02/22/15 0348 02/23/15 0220  WBC 11.7* 12.4* 11.8*  NEUTROABS 7.9* 8.3*  --   HGB 9.9* 9.0* 9.4*  HCT 31.5* 27.7* 29.2*  MCV 85.6 83.9 83.2  PLT 235 305 [id number]    Basic Metabolic Panel:   Recent Labs Lab 02/21/15 0314 02/22/15 0348 02/23/15 0220  NA 140 138 137  K 3.5 3.5 3.1*  CL 103 102 100*  CO2 26 26 25   GLUCOSE 120* 104* 96  BUN 6 7 7   CREATININE 0.71 0.66 0.70  CALCIUM 9.1 9.1 9.2  MG 2.0 1.9  --   PHOS 3.5 3.9  --     Lipid Panel:     Component Value Date/Time   CHOL 216* 02/20/2015 0330   TRIG 187* 02/22/2015 0348   HDL 27* 02/20/2015 0330   CHOLHDL 8.0 02/20/2015 0330   VLDL UNABLE TO CALCULATE IF TRIGLYCERIDE OVER 400 mg/dL 02/20/2015 0330   LDLCALC UNABLE TO CALCULATE IF TRIGLYCERIDE OVER 400 mg/dL 02/20/2015 0330   HgbA1c:  Lab Results  Component Value Date   HGBA1C 6.0* 02/21/2015   Urine Drug Screen: No results found for: LABOPIA, COCAINSCRNUR, LABBENZ, AMPHETMU, THCU, LABBARB    IMAGING  I have personally reviewed the radiological images below and agree with the radiology interpretations.  Ct Head Wo Contrast 02/23/2015 Worsening cytotoxic edema throughout the large area of LEFT MCA infarction with early LEFT-to-RIGHT shift of 4 mm. No uncal herniation at this time but continued surveillance is warranted. No progression multiple areas hemorrhagic  transformation. 02/21/2015  Evolving large LEFT MCA territory infarct with petechial hemorrhage. Two similar hematomas within the LEFT temporal occipital lobe. Multifocal small areas of acute ischemia and petechial hemorrhage, RIGHT cerebrum. Small cerebellar infarcts better seen on prior MRI. 02/20/2015  1. Evolving large MCA territory infarct and additional patchy right cerebral infarcts. Findings are better delineated on MRI of the brain performed on the same day.  2. Stable posterior left temporal and occipital hemorrhages.  3. Decreased conspicuity of small volume right parietal hemorrhage. 02/19/2015 1656  1. Stable appearance of posterior left temporal hemorrhages.  2. Evolving large left MCA territory infarct.  3. Densities in the right frontal and parietal lobes likely represents evolving infarcts with decreasing parenchymal enhancement. 4. Small right parietal hemorrhage is stable. 02/19/2015 0907  1. Hemorrhagic conversion in the left occipital lobe infarcts with two hematomas up to 22 mm diameter.  2. Multi focal bilateral cerebral convexity high density is likely enhancement from subacute infarct. Pattern suggests recurrent central embolic disease.  02/19/2015 0428 Remote infarctions with encephalomalacia. Generalized atrophy and chronic white matter changes consistent with small vessel disease. No acute intracranial findings.   CTA NECK  02/19/2015  1. Atheromatous plaque about the left carotid bifurcation/proximal left ICA with associated short segment stenosis of approximately 40-50% by NASCET criteria.  2. Mild atheromatous plaque about the right carotid bifurcation/proximal right ICA without hemodynamically significant stenosis. 3. Atheromatous disease  at the origin of the left vertebral artery with associated severe ostial stenosis. The dominant right vertebral artery is widely patent along its entire course.   CTA HEAD  02/19/2015  1. Short segment focal defect  within the mid left M1 segment, worrisome for nonocclusive thrombus. There may be a superimposed stenosis at this level as well. Flow is present within the left M1 segment distally as well as the proximal M2 branches, however, the MCA branches are markedly attenuated and possibly occluded distally.  2. Atheromatous disease within the cavernous ICAs with associated mild to moderate multi focal irregularity and stenosis.  3. Irregularity with multi focal moderate to severe narrowing of the left V4 segment. Dominant right vertebral artery widely patent.   Cerebral Angiogram 02/19/2015 S/P Lt common carotid arteriogram,followed by complete revascularization of occluded LT LMCA M 1 With x1 pass with 64mm x 30 mm Trevoprovue retrieval device and 2.5 mg of superselective intracranial TPA  TICI 3 flow restored  MRI head 02/20/2015  1. Evolving large confluent left MCA territory infarct, with additional patchy infarcts within the right frontal, parietal, and occipital regions, with additional small scattered bilateral cerebellar infarcts.  2. Stable hematomas within the left temporal and occipital lobes, consistent with hemorrhagic transformation. Small volume hemorrhage in the right parietal region also stable.  Chest Xray 02/20/2015 Patchy density peripherally in the left lower lung likely reflecting atelectasis or early pneumonia. The endotracheal tube is in reasonable position radiographically.  2D echo  - Left ventricle: The cavity size was normal. Wall thickness wasincreased in a pattern of mild LVH. Systolic function was normal.The estimated ejection fraction was in the range of 60% to 65%.Wall motion was normal; there were no regional wall motionabnormalities. Doppler parameters are consistent with abnormalleft ventricular relaxation (grade 1 diastolic dysfunction). - Mitral valve: There was mild regurgitation. - Pulmonary arteries: Systolic pressure was moderately increased.PA peak pressure:  44 mm Hg (S).  TEE 0.6 x 0.5 cm mitral valve mass on the tip of the anteriorleaflet, suggestive of papillary fibroelastoma - vegetation,thrombus or Libman-Sacks endocarditis are less likely findings. The mitral valve is notable for a shaggy, non-pedunculated round mass noted at the tip of the anterior mitral leaflet (it measures about 0.5 cm x 0.6 cm). This is on the atrial side of the valve and may prolapse across the valve plane. It was visualized in 2D and 3D views. There is Mild regurgitation. There is no stenosis.  Hypercoag pending   PHYSICAL EXAM  Temp:  [97.5 F (36.4 C)-100.4 F (38 C)] 97.5 F (36.4 C) (12/04 0926) Pulse Rate:  [73-115] 73 (12/04 0926) Resp:  [16-20] 18 (12/04 0926) BP: (155-172)/(72-83) 163/79 mmHg (12/04 0926) SpO2:  [90 %-100 %] 93 % (12/04 0926)  General - Well nourished, well developed, in no apparent distress.  Ophthalmologic - Fundi not visualized due to noncooperation.  Cardiovascular - Regular rate and rhythm.  Extremities - cyanosis at the fingers bilaterally, but there are also splinter hemorrhages underneath fingernails.  Neuro - awake, alert, but aphasic, no speech outpt, able to intermittently follow some simple commands, but inconsistent. Not able to test naming or repeating. Right side neglect and left eye preference. PERRL, eyes able to cross midline. Right facial droop, LUE and LLE 4/5 and RUE and RLE withdraw to pain. DTR 1+ and right babinski positive.   ASSESSMENT/PLAN Ms. Landis Beamon is a 59 y.o. female with history of HTN and a stroke 6 weeks ago due to MV abnormality for which she refused surgery  presenting with right hemiparesis, aphasia and L gaze preference. She received IV t-PA 02/19/2015 at 0436 and complete revascularization of L M1 with trevo and IA tPA.   Stroke: large left MCA large infarcts, patchy R frontal, parietal and occipital infarcts and bilateral cerebellar infarcts s/p IV tPA and TICI3 revascularizaion of L  M1 with mechanical embolectomy and IA tPA.  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 Hemorrhagic transformation at R parietal, L temorporal and occipital lobes.   Resultant aphasia, right hemiparesis, right neglect and L gaze preference  Post IR CT with L occipital hemorrhagic transformation.  MRI left MCA territory infarcts, patchy R frontal, parietal and occipital infarcts and bilateral cerebellar infarcts   Repeat CT head 12/1 and 12/2 stable - Left hemorrhagic transformation, decreasing R parietal hemorrhage  2D Echo No SOE reported. Upon review with cardiology, MV abnormality present.   TEE - 0.6x0.5 mitral valve mass on the anterior leaflet suggestive of papillary fibroelastoma, but limbman-sacks endocarditis not ruled out yet  LDL unable to calculate, TG 450  HgbA1c 6.4  Lovenox 40 mg sq daily VTE prophylaxis.   Diet NPO time specified .  No antithrombotic prior to admission, no aspirin at this time due to hemorrhage.  Per boyfriend, pt had a stroke 6 weeks ago in [place] thought to be related to a growth on her MV for which the doctors recommended surgery, which pt refused. Was treated with lovenox x 1 week, then stopped.  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  Disposition: pending   Cerebral edema  CT showed midline shift  No uncal herniation  Neuro stable   Continue monitoring  Subacute embolic infarct in [id number]  Per boyfriend, pt had a stroke 6 weeks ago in [place] thought to be related to a growth on her MV for which the doctors recommended surgery, which pt refused. Was treated with lovenox x 1 week, then stopped.  Initial  CT head - subacute left frontal infarct and R parietal hemorrhagic infarct which remains pathcy hyperintense  Peripheral cyanosis  Possible Raynaud's vs ischemic event  Bilateral tips of hands and feet ecchymotic, with splinter hemorrhages  Boyfriend reports this was present during previous hospitalization and MD's were not concerned  Labs - Anti-scleroderma antibody elevated at 3.7, ANA positive 1:320, centromere abx neg.  Hypercoagulable and other autoimmune work up - pending  Dysphagia  Did not pass swallow  MBS pending in am  NPO  IVF  Hypertension  Stable  On labetolol iv PRN   Hyperlipidemia  Home meds: lipitor 20  LDL unable to calculate, goal < 70  Resume statin once able to swallow or has a feeding tube  Plan continue statin at discharge  Other Stroke Risk Factors  Hx stroke/TIA  Anemia - appears stable  Consult Dr Roxy Manns - not a surgical candidate at this time.  Hospital day # 4  Rosalin Hawking, MD PhD Stroke Neurology 02/23/2015 1:50 PM   To contact Stroke Continuity provider, please refer to [email or web address] After hours, contact General Neurology

## 2015-02-23 NOTE — Progress Notes (Addendum)
Speech Language Pathology Treatment: Dysphagia  Patient Details Name: Dorothe Mitten MRN: [id number] DOB: 1955-07-01 Today's Date: 02/23/2015 Time: 1100-1120 SLP Time Calculation (min) (ACUTE ONLY): 20 min  Assessment / Plan / Recommendation Clinical Impression  Dysphagia treatment provided today for PO trials. 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 Pt consumed ice chips, small bites of puree, and small sips of thin liquid by straw without overt s/s of aspiration and no oral residuals or pocketing; suspect a mildly delayed initiation in swallow function. It is difficult to rule out aspiration at bedside as pt was unable to initiate vocalization or speech despite max verbal/ visual/ tactile cues. During evaluation on 12/3, pt was noted to have a wet gurgly sound post-swallow. Given these findings, would like to objectively evaluate swallow function before initiating diet. Recommend that pt remain NPO pending results of MBS, provide frequent oral care. Radiology is unable to complete MBS today but hope to complete first thing tomorrow a.m. Will continue to follow.   HPI HPI: Yalexa Handelman is an 59 y.o. female with a past medical history significant for HTN, ischemic stroke last September with mild residual right sided weakness, brought to the ED by EMS due to acute onset of right hemiparesis, aphasia and left gaze preference.  Husband said that they were in bed talking when suddenly she became confused, unable to speak and shortly thereafter couldn't move the right side and was constantly looking to the left .EMS was summoned and patient brought to the ED.      SLP Plan  New goals to be determined pending instrumental study;Continue with current plan of care     Recommendations  Diet recommendations: NPO Medication Administration: Via alternative means              General recommendations: Rehab consult Oral Care Recommendations: Oral care  QID Follow up Recommendations: Inpatient Rehab Plan: New goals to be determined pending instrumental study;Continue with current plan of care   Kern Reap, [place], CCC-SLP 02/23/2015, 11:31 AM [phone]

## 2015-02-24 ENCOUNTER — Inpatient Hospital Stay (HOSPITAL_COMMUNITY): Payer: Medicaid - Out of State

## 2015-02-24 LAB — GLUCOSE, CAPILLARY
GLUCOSE-CAPILLARY: 95 mg/dL (ref 65–99)
GLUCOSE-CAPILLARY: 100 mg/dL — AB (ref 65–99)
Glucose-Capillary: 86 mg/dL (ref 65–99)

## 2015-02-24 LAB — BASIC METABOLIC PANEL
ANION GAP: 13 (ref 5–15)
BUN: 7 mg/dL (ref 6–20)
CALCIUM: 9.2 mg/dL (ref 8.9–10.3)
CO2: 25 mmol/L (ref 22–32)
Chloride: 99 mmol/L — ABNORMAL LOW (ref 101–111)
Creatinine, Ser: 0.67 mg/dL (ref 0.44–1.00)
GFR calc Af Amer: >60 mL/min (ref >60)
GFR calc non Af Amer: >60 mL/min (ref >60)
GLUCOSE: 102 mg/dL — AB (ref 65–99)
Potassium: 3 mmol/L — ABNORMAL LOW (ref 3.5–5.1)
Sodium: 137 mmol/L (ref 135–145)

## 2015-02-24 LAB — CBC
HEMATOCRIT: 30.8 % — AB (ref 36.0–46.0)
Hemoglobin: 10.3 g/dL — ABNORMAL LOW (ref 12.0–15.0)
MCH: 27.3 pg (ref 26.0–34.0)
MCHC: 33.4 g/dL (ref 30.0–36.0)
MCV: 81.7 fL (ref 78.0–100.0)
Platelets: 456 10*3/uL — ABNORMAL HIGH (ref 150–400)
RBC: 3.77 MIL/uL — ABNORMAL LOW (ref 3.87–5.11)
RDW: 13.4 % (ref 11.5–15.5)
WBC: 10.9 10*3/uL — AB (ref 4.0–10.5)

## 2015-02-24 LAB — SJOGRENS SYNDROME-B EXTRACTABLE NUCLEAR ANTIBODY: SSB (La) (ENA) Antibody, IgG: <0.2 AI (ref 0.0–0.9)

## 2015-02-24 LAB — ANTI-DNA ANTIBODY, DOUBLE-STRANDED: ds DNA Ab: 1 IU/mL (ref 0–9)

## 2015-02-24 LAB — SJOGRENS SYNDROME-A EXTRACTABLE NUCLEAR ANTIBODY

## 2015-02-24 LAB — HOMOCYSTEINE: Homocysteine: 12.7 umol/L (ref 0.0–15.0)

## 2015-02-24 LAB — C3 COMPLEMENT: C3 Complement: 175 mg/dL — ABNORMAL HIGH (ref 82–167)

## 2015-02-24 LAB — RHEUMATOID FACTOR

## 2015-02-24 LAB — C4 COMPLEMENT: Complement C4, Body Fluid: 31 mg/dL (ref 14–44)

## 2015-02-24 LAB — ANTI-SMITH ANTIBODY

## 2015-02-24 MED ORDER — AMLODIPINE BESYLATE 5 MG PO TABS
5.0000 mg | ORAL_TABLET | Freq: Every day | ORAL | Status: DC
  Administered 2015-02-24 – 2015-02-25 (×2): 5 mg via ORAL
  Filled 2015-02-24 (×2): qty 1

## 2015-02-24 MED ORDER — ATORVASTATIN CALCIUM 10 MG PO TABS
20.0000 mg | ORAL_TABLET | Freq: Every day | ORAL | Status: DC
Start: 2015-02-24 — End: 2015-02-25
  Administered 2015-02-24 – 2015-02-25 (×2): 20 mg via ORAL
  Filled 2015-02-24 (×2): qty 2

## 2015-02-24 NOTE — Procedures (Signed)
Objective Swallowing Evaluation: MBS-Modified Barium Swallow Study  Patient Details  Name: Erin Galloway MRN: [id number] Date of Birth: 10/02/1955  Today's Date: 02/24/2015 Time: SLP Start Time (ACUTE ONLY): 1030-SLP Stop Time (ACUTE ONLY): 1100 SLP Time Calculation (min) (ACUTE ONLY): 30 min  Past Medical History:  Past Medical History  Diagnosis Date  . Stroke [facility]) 2016    September   . Hypertension    Past Surgical History:  Past Surgical History  Procedure Laterality Date  . Induced abortion      Two abortions   . Radiology with anesthesia N/A 02/19/2015    Procedure: RADIOLOGY WITH ANESTHESIA;  Surgeon: Luanne Bras, MD;  Location: [facility];  Service: Radiology;  Laterality: N/A;   HPI: Erin Galloway is an 59 y.o. female with a past medical history significant for HTN, ischemic stroke last September with mild residual right sided weakness, brought to the ED by EMS due to acute onset of right hemiparesis, aphasia and left gaze preference.  Husband said that they were in bed talking when suddenly she became confused, unable to speak and shortly thereafter couldn't move the right side and was constantly looking to the left .EMS was summoned and patient brought to the ED.  Subjective: Pt was seen in radiology for MBS  Assessment / Plan / Recommendation  CHL IP CLINICAL IMPRESSIONS 02/24/2015  Therapy Diagnosis Moderate oral phase dysphagia;Moderate pharyngeal phase dysphagia  Clinical Impression Moderate oropharyngeal dysphagia, with sensory and motor deficits. Orally, pt exhibits delayed prep and propulsion of puree and solid consistencies, and was unable to manage a whole barium tablet. The pill was removed from pt oral cavity.  No significant oral pocketing was noted during this assessment, however, is likely. Pharyngeally, pt exhibits delayed swallow reflex, with trigger noted at the level of the vallecula. Trace vallecula residue noted on thin liquids and puree, and  moderate vallecular residue noted on solid. Alternating solid with either puree or liquid reduced residue effectively. Pt was unable to use straw to drink thin liquids, and required assistance with cup sips. Intermittent trace flash penetration of thin liquids was noted, but penetrate cleared completely and was not aspirated. At this time, Dys 2 diet with thin liquid via cup is recommended, with crushed meds. ST will continue to follow to assess diet tolerance and readiness to advance.   Impact on safety and function Moderate aspiration risk      CHL IP TREATMENT RECOMMENDATION 02/24/2015  Treatment Recommendations Therapy as outlined in treatment plan below     Prognosis 02/24/2015  Prognosis for Safe Diet Advancement Good  Barriers to Reach Goals Language deficits;Severity of deficits  Barriers/Prognosis Comment --    CHL IP DIET RECOMMENDATION 02/24/2015  SLP Diet Recommendations Dysphagia 2 (Fine chop) solids;Thin liquid  Liquid Administration via Cup;No straw  Medication Administration Crushed with puree  Compensations Minimize environmental distractions;Slow rate;Small sips/bites;Follow solids with liquid  Postural Changes Seated upright at 90 degrees;Remain semi-upright after after feeds/meals (Comment)      CHL IP OTHER RECOMMENDATIONS 02/24/2015  Recommended Consults --  Oral Care Recommendations Oral care BID  Other Recommendations --      CHL IP FOLLOW UP RECOMMENDATIONS 02/24/2015  Follow up Recommendations Inpatient Rehab;24 hour supervision/assistance      CHL IP FREQUENCY AND DURATION 02/24/2015  Speech Therapy Frequency (ACUTE ONLY) min 3x week  Treatment Duration 2 weeks           CHL IP ORAL PHASE 02/24/2015  Oral Phase --  Oral - Pudding Teaspoon --  Oral - Pudding Cup --  Oral - Honey Teaspoon --  Oral - Honey Cup --  Oral - Nectar Teaspoon --  Oral - Nectar Cup --  Oral - Nectar Straw --  Oral - Thin Teaspoon --  Oral - Thin Cup --  Oral - Thin Straw --   Oral - Puree Reduced posterior propulsion;Delayed oral transit  Oral - Mech Soft --  Oral - Regular --  Oral - Multi-Consistency --  Oral - Pill --  Oral Phase - Comment --    CHL IP PHARYNGEAL PHASE 02/24/2015  Pharyngeal Phase Impaired  Pharyngeal- Pudding Teaspoon --  Pharyngeal --  Pharyngeal- Pudding Cup --  Pharyngeal --  Pharyngeal- Honey Teaspoon --  Pharyngeal --  Pharyngeal- Honey Cup --  Pharyngeal --  Pharyngeal- Nectar Teaspoon --  Pharyngeal --  Pharyngeal- Nectar Cup --  Pharyngeal --  Pharyngeal- Nectar Straw --  Pharyngeal --  Pharyngeal- Thin Teaspoon --  Pharyngeal --  Pharyngeal- Thin Cup Delayed swallow initiation-vallecula;Penetration/Aspiration during swallow  Pharyngeal Material does not enter airway;Material enters airway, remains ABOVE vocal cords then ejected out  Pharyngeal- Thin Straw --  Pharyngeal --  Pharyngeal- Puree Delayed swallow initiation-vallecula;Pharyngeal residue - valleculae  Pharyngeal --  Pharyngeal- Mechanical Soft --  Pharyngeal --  Pharyngeal- Regular --  Pharyngeal --  Pharyngeal- Multi-consistency Pharyngeal residue - valleculae  Pharyngeal --  Pharyngeal- Pill --  Pharyngeal --  Pharyngeal Comment --     CHL IP CERVICAL ESOPHAGEAL PHASE 02/24/2015  Cervical Esophageal Phase WFL  Pudding Teaspoon --  Pudding Cup --  Honey Teaspoon --  Honey Cup --  Nectar Teaspoon --  Nectar Cup --  Nectar Straw --  Thin Teaspoon --  Thin Cup --  Thin Straw --  Puree --  Mechanical Soft --  Regular --  Multi-consistency --  Pill --  Cervical Esophageal Comment --     Shonna Chock 02/24/2015, 11:35 AM          Faigy Stretch [address]. [place], [place], [place]

## 2015-02-24 NOTE — Progress Notes (Signed)
We have received a prescreen request from acute therapy.  I have reviewed chart and pt's current progress.  Per medical record, pt. Is essentially homeless and residing at [facility] with her boyfriend.  At this time, we are not recommending IP Rehab consult .  Pt. Will likely need an extended rehab recovery and SNF likely best setting for her.  I have discussed with Lorne Skeens, CM.  I will sign off.    [facility] Admissions Coordinator Cell [phone] Office [phone]

## 2015-02-24 NOTE — Clinical Social Work Placement (Signed)
   CLINICAL SOCIAL WORK PLACEMENT  NOTE  Date:  02/24/2015  Patient Details  Name: Erin Galloway MRN: [id number] Date of Birth: 30-Apr-1955  Clinical Social Work is seeking post-discharge placement for this patient at the [place] level of care (*CSW will initial, date and re-position this form in  chart as items are completed):  Yes   Patient/family provided with [place] Work Department's list of facilities offering this level of care within the geographic area requested by the patient (or if unable, by the patient's family).  Yes   Patient/family informed of their freedom to choose among providers that offer the needed level of care, that participate in Medicare, Medicaid or managed care program needed by the patient, have an available bed and are willing to accept the patient.  Yes   Patient/family informed of [facility]'s ownership interest in [facility] and [facility], as well as of the fact that they are under no obligation to receive care at these facilities.  PASRR submitted to EDS on 02/24/15     PASRR number received on 02/24/15     Existing PASRR number confirmed on       FL2 transmitted to all facilities in geographic area requested by pt/family on 02/24/15     FL2 transmitted to all facilities within larger geographic area on 02/24/15     Patient informed that his/her managed care company has contracts with or will negotiate with certain facilities, including the following:            Patient/family informed of bed offers received.  Patient chooses bed at       Physician recommends and patient chooses bed at      Patient to be transferred to   on  .  Patient to be transferred to facility by       Patient family notified on   of transfer.  Name of family member notified:        PHYSICIAN Please sign FL2     Additional Comment:    _______________________________________________ Rozell Searing,  LCSW 02/24/2015, 2:10 PM

## 2015-02-24 NOTE — Clinical Social Work Note (Signed)
Clinical Social Work Assessment  Patient Details  Name: Elodia Haviland MRN: [id number] Date of Birth: Nov 28, 1955  Date of referral:  02/24/15               Reason for consult:  Facility Placement, Discharge Planning                Permission sought to share information with:  Family Supports, Case Freight forwarder, Chartered certified accountant granted to share information::  Yes, Verbal Permission Granted  Name::      ([facility])  Agency::   (SNF's )  Relationship::   (Significant Other )  Contact Information:   [phone])  Housing/Transportation Living arrangements for the past 2 months:  Hotel/Motel Source of Information:   (Significant Other ) Patient Interpreter Needed:  None Criminal Activity/Legal Involvement Pertinent to Current Situation/Hospitalization:  No - Comment as needed Significant Relationships:  Parents, Significant Other Lives with:  Significant Other Do you feel safe going back to the place where you live?  No Need for family participation in patient care:  Yes (Comment)  Care giving concerns:  SNF placement.    Social Worker assessment / plan:  CSW met with patient and pt's significant other, [place] at bedside. CSW introduced CSW role and SNF process. CSW reviewed and provided SNF list. Pt is non-verbal due to stroke however follow commands. CSW explained that since pt has out-of-state Medicaid she will potentially need to stay at SNF for the minimun of 30 days. Pt's significant other has agreed. Pt's boyfriend reported that he and pt have been living at [facility] for almost a month. The couple recently moved from [facility]. Pt's significant other reported that pt's mother and family is very supportive. Pt is agreeable to SNF placement. No further concerns reported at this time by pt or pt's significant other. CSW will continue to follow pt and pt's family for continued support and to facilitate pt's discharge needs once medically  stable.    Employment status:  Retired Forensic scientist:  Catering manager PT Recommendations:  [place] / Referral to community resources:  [place]  Patient/Family's Response to care:  Pt and pt's significant other agreeable to SNF. Pt's significant other pleasant and strongly involved at bedside. Pt's significant other appreciated social work intervention.   Patient/Family's Understanding of and Emotional Response to Diagnosis, Current Treatment, and Prognosis:  Pt's family and significant other knowledgeable of medical work up and continued treatment.   Emotional Assessment Appearance:  Appears stated age Attitude/Demeanor/Rapport:  Unable to Assess Affect (typically observed):  Unable to Assess Orientation:  Oriented to Situation, Oriented to  Time, Oriented to Place, Oriented to Self Alcohol / Substance use:  Not Applicable Psych involvement (Current and /or in the community):  No (Comment)  Discharge Needs  Concerns to be addressed:  Care Coordination Readmission within the last 30 days:  No Current discharge risk:  Dependent with Mobility Barriers to Discharge:  Continued Medical Work up   [employer], MSW, LCSWA [phone] 02/24/2015 4:46 PM

## 2015-02-24 NOTE — Progress Notes (Signed)
Physical Therapy Treatment Patient Details Name: Erin Galloway MRN: [id number] DOB: 1955-12-22 Today's Date: 02/24/2015    History of Present Illness Erin Galloway is an 59 y.o. 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  Post tPA hemorrhage Rt parietal and Lt temporal and occipital lobes.  CT on 12/1 and 12/2 demonstrated stable Lt hemorrhages and decreasing Rt parietal hemorrhage.      PT Comments    Focus of session was sitting balance and standing activity at EOB. Attempted use of mirror for self-correction of posture and lateral lean, however pt was not able to focus on her reflection. R inattention consistent throughout session, even when stimulus was on her right side. Will continue to follow and progress as able per POC. Noted CIR recommending SNF at d/c.  Follow Up Recommendations  SNF;Supervision/Assistance - 24 hour (CIR recommending SNF)     Equipment Recommendations  Other (comment) (TBD)    Recommendations for Other Services       Precautions / Restrictions Precautions Precautions: Fall Restrictions Weight Bearing Restrictions: No    Mobility  Bed Mobility Overal bed mobility: Needs Assistance Bed Mobility: Rolling;Sidelying to Sit;Sit to Supine Rolling: Max assist;+2 for physical assistance (to right with verbal and tactile cues for sequencing) Sidelying to sit: Max assist;+2 for physical assistance   Sit to supine: Total assist;+2 for physical assistance   General bed mobility comments: A to manage Bil LEs and A to trunk to push up to sitting.   Transfers Overall transfer level: Needs  assistance Equipment used: 2 person hand held assist (1 person A standing in front of her using gait belt) Transfers: Sit to/from Stand Sit to Stand: Max assist;+2 physical assistance         General transfer comment: Use of gait belt and blocking Rt knee. Pt stood EOB x3. On second and third attempt pt held onto back of a chair positioned in front of the patient for support. Was not able to improve standing with LUE support.   Ambulation/Gait                 Stairs            Wheelchair Mobility    Modified Rankin (Stroke Patients Only) Modified Rankin (Stroke Patients Only) Pre-Morbid Rankin Score: No significant disability (slight Rt sided residual weakness from previous stroke) Modified Rankin: Severe disability     Balance Overall balance assessment: Needs assistance Sitting-balance support: Feet supported;Single extremity supported Sitting balance-Leahy Scale: Poor Sitting balance - Comments: No use of RUE during sitting balance. Pt was pushing herself to the R with LUE. Was able to correct briefly after full side lean to the L onto elbow. Mirror placed in front of patient and she was not able to use her reflection to correct sitting position or posture.  Postural control: Posterior lean;Right lateral lean Standing balance support: Bilateral upper extremity supported;During functional activity Standing balance-Leahy Scale: Zero                      Cognition Arousal/Alertness: Lethargic Behavior During Therapy: Flat affect Overall Cognitive Status: Impaired/Different from baseline Area of Impairment: Following commands;Safety/judgement;Problem solving  Following Commands: Follows one step commands inconsistently Safety/Judgement: Decreased awareness of safety;Decreased awareness of deficits   Problem Solving: Slow processing;Decreased initiation;Difficulty sequencing;Requires verbal cues;Requires tactile cues (requires gestural  cues) General Comments: Pt able to follow commands inconsistently.  Demonstrates ability to smile, blink her eyes.  But creates fist w/ Lt hand in response to most commands.      Exercises      General Comments        Pertinent Vitals/Pain Pain Assessment: Faces Faces Pain Scale: No hurt    Home Living                      Prior Function            PT Goals (current goals can now be found in the care plan section) Acute Rehab PT Goals Patient Stated Goal: unable to state due to expressive difficulties PT Goal Formulation: Patient unable to participate in goal setting Time For Goal Achievement: 03/15/15 Potential to Achieve Goals: Good Progress towards PT goals: Progressing toward goals    Frequency  Min 4X/week    PT Plan Current plan remains appropriate    Co-evaluation             End of Session Equipment Utilized During Treatment: Gait belt;Oxygen Activity Tolerance: Patient limited by lethargy;Patient limited by fatigue Patient left: in bed;with bed alarm set;with call bell/phone within reach     Time: 0759-0828 PT Time Calculation (min) (ACUTE ONLY): 29 min  Charges:  $Therapeutic Activity: 23-37 mins                    G Codes:      Rolinda Roan March 25, 2015, 10:39 AM   Rolinda Roan, PT, DPT Acute Rehabilitation Services Pager: [phone]

## 2015-02-24 NOTE — Progress Notes (Signed)
STROKE TEAM PROGRESS NOTE   SUBJECTIVE (INTERVAL HISTORY) Ray is at the bedside. He is asking about her plan of care, Dr. Erlinda Hong  discussed diagnosis, prognosis,  treatment options and plan of care with he and the patient. Pt passed swallow. Decrease IVF to 30cc/h.     OBJECTIVE Temp:  [97.7 F (36.5 C)-99.9 F (37.7 C)] 99.9 F (37.7 C) (12/05 0950) Pulse Rate:  [88-111] 101 (12/05 0950) Cardiac Rhythm:  [-] Normal sinus rhythm (12/05 0700) Resp:  [18-20] 20 (12/05 0512) BP: (158-174)/(88-98) 170/88 mmHg (12/05 0950) SpO2:  [93 %-100 %] 100 % (12/05 0950)  CBC:   Recent Labs Lab 02/21/15 0314 02/22/15 0348 02/23/15 0220 02/24/15 0311  WBC 11.7* 12.4* 11.8* 10.9*  NEUTROABS 7.9* 8.3*  --   --   HGB 9.9* 9.0* 9.4* 10.3*  HCT 31.5* 27.7* 29.2* 30.8*  MCV 85.6 83.9 83.2 81.7  PLT 235 305 399 456*    Basic Metabolic Panel:   Recent Labs Lab 02/21/15 0314 02/22/15 0348 02/23/15 0220 02/24/15 0311  NA 140 138 137 137  K 3.5 3.5 3.1* 3.0*  CL 103 102 100* 99*  CO2 26 26 25 25   GLUCOSE 120* 104* 96 102*  BUN 6 7 7 7   CREATININE 0.71 0.66 0.70 0.67  CALCIUM 9.1 9.1 9.2 9.2  MG 2.0 1.9  --   --   PHOS 3.5 3.9  --   --     Lipid Panel:     Component Value Date/Time   CHOL 216* 02/20/2015 0330   TRIG 187* 02/22/2015 0348   HDL 27* 02/20/2015 0330   CHOLHDL 8.0 02/20/2015 0330   VLDL UNABLE TO CALCULATE IF TRIGLYCERIDE OVER 400 mg/dL 02/20/2015 0330   LDLCALC UNABLE TO CALCULATE IF TRIGLYCERIDE OVER 400 mg/dL 02/20/2015 0330   HgbA1c:  Lab Results  Component Value Date   HGBA1C 6.0* 02/21/2015   Urine Drug Screen: not done   IMAGING Ct Head Wo Contrast 02/23/2015 Worsening cytotoxic edema throughout the large area of LEFT MCA infarction with early LEFT-to-RIGHT shift of 4 mm. No uncal herniation at this time but continued surveillance is warranted. No progression multiple areas hemorrhagic transformation. 02/21/2015  Evolving large LEFT MCA territory  infarct with petechial hemorrhage. Two similar hematomas within the LEFT temporal occipital lobe. Multifocal small areas of acute ischemia and petechial hemorrhage, RIGHT cerebrum. Small cerebellar infarcts better seen on prior MRI. 02/20/2015  1. Evolving large MCA territory infarct and additional patchy right cerebral infarcts. Findings are better delineated on MRI of the brain performed on the same day.  2. Stable posterior left temporal and occipital hemorrhages.  3. Decreased conspicuity of small volume right parietal hemorrhage. 02/19/2015 1656  1. Stable appearance of posterior left temporal hemorrhages.  2. Evolving large left MCA territory infarct.  3. Densities in the right frontal and parietal lobes likely represents evolving infarcts with decreasing parenchymal enhancement. 4. Small right parietal hemorrhage is stable. 02/19/2015 0907  1. Hemorrhagic conversion in the left occipital lobe infarcts with two hematomas up to 22 mm diameter.  2. Multi focal bilateral cerebral convexity high density is likely enhancement from subacute infarct. Pattern suggests recurrent central embolic disease.  02/19/2015 0428 Remote infarctions with encephalomalacia. Generalized atrophy and chronic white matter changes consistent with small vessel disease. No acute intracranial findings.   CTA NECK  02/19/2015  1. Atheromatous plaque about the left carotid bifurcation/proximal left ICA with associated short segment stenosis of approximately 40-50% by NASCET criteria.  2. Mild atheromatous  plaque about the right carotid bifurcation/proximal right ICA without hemodynamically significant stenosis. 3. 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 The dominant right vertebral artery is widely patent along its entire course.   CTA HEAD  02/19/2015  1. Short segment focal defect within the mid left M1 segment, worrisome for nonocclusive thrombus.  There may be a superimposed stenosis at this level as well. Flow is present within the left M1 segment distally as well as the proximal M2 branches, however, the MCA branches are markedly attenuated and possibly occluded distally.  2. Atheromatous disease within the cavernous ICAs with associated mild to moderate multi focal irregularity and stenosis.  3. Irregularity with multi focal moderate to severe narrowing of the left V4 segment. Dominant right vertebral artery widely patent.   Cerebral Angiogram 02/19/2015 S/P Lt common carotid arteriogram,followed by complete revascularization of occluded LT LMCA M 1 With x1 pass with 6mm x 30 mm Trevoprovue retrieval device and 2.5 mg of superselective intracranial TPA  TICI 3 flow restored  MRI head 02/20/2015  1. Evolving large confluent left MCA territory infarct, with additional patchy infarcts within the right frontal, parietal, and occipital regions, with additional small scattered bilateral cerebellar infarcts.  2. Stable hematomas within the left temporal and occipital lobes, consistent with hemorrhagic transformation. Small volume hemorrhage in the right parietal region also stable.  Chest Xray 02/20/2015 Patchy density peripherally in the left lower lung likely reflecting atelectasis or early pneumonia. The endotracheal tube is in reasonable position radiographically.  2D echo  - Left ventricle: The cavity size was normal. Wall thickness wasincreased in a pattern of mild LVH. Systolic function was normal.The estimated ejection fraction was in the range of 60% to 65%.Wall motion was normal; there were no regional wall motionabnormalities. Doppler parameters are consistent with abnormalleft ventricular relaxation (grade 1 diastolic dysfunction). - Mitral valve: There was mild regurgitation. - Pulmonary arteries: Systolic pressure was moderately increased.PA peak pressure: 44 mm Hg (S).  TEE 0.6 x 0.5 cm mitral valve mass on the tip of  the anteriorleaflet, suggestive of papillary fibroelastoma - vegetation,thrombus or Libman-Sacks endocarditis are less likely findings. The mitral valve is notable for a shaggy, non-pedunculated round mass noted at the tip of the anterior mitral leaflet (it measures about 0.5 cm x 0.6 cm). This is on the atrial side of the valve and may prolapse across the valve plane. It was visualized in 2D and 3D views. There is Mild regurgitation. There is no stenosis.   PHYSICAL EXAM General - Well nourished, well developed, in no apparent distress.  Ophthalmologic - Fundi not visualized due to noncooperation.  Cardiovascular - Regular rate and rhythm.  Extremities - cyanosis at the fingers bilaterally, but there are also splinter hemorrhages underneath fingernails.  Neuro - awake, alert, but aphasic, no speech outpt, able to intermittently follow some simple commands, but inconsistent. Not able to test naming or repeating. Right side neglect and left eye preference. PERRL, eyes able to cross midline. Right facial droop, LUE and LLE 4/5 and RUE and RLE withdraw to pain. DTR 1+ and right babinski positive.    ASSESSMENT/PLAN Ms. Carolyna Snoddy is a 59 y.o. female with history of HTN and a stroke 6 weeks ago due to MV abnormality for which she refused surgery presenting with right hemiparesis, aphasia and L gaze preference. She received IV t-PA 02/19/2015 at 0436 and complete revascularization of L M1 with trevo and IA tPA.   Stroke: large left  MCA large infarcts, patchy R frontal, parietal and occipital infarcts and bilateral cerebellar infarcts s/p IV tPA and TICI3 revascularizaion of L M1 with mechanical embolectomy and IA tPA.  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 Hemorrhagic transformation at R parietal, L temorporal and occipital lobes.  Resultant aphasia, right hemiparesis, right neglect and L gaze preference  Post IR CT  with L occipital hemorrhagic transformation.  MRI left MCA territory infarcts, patchy R frontal, parietal and occipital infarcts and bilateral cerebellar infarcts   Repeat CT head 12/1 and 12/2 stable - Left hemorrhagic transformation, decreasing R parietal hemorrhage  2D Echo No SOE reported. Upon review with cardiology, MV abnormality present.   TEE - 0.6x0.5 mitral valve mass on the anterior leaflet suggestive of papillary fibroelastoma, but limbman-sacks endocarditis not ruled out yet  LDL unable to calculate, TG 450  HgbA1c 6.4  Lovenox 40 mg sq daily VTE prophylaxis.   DIET DYS 2 Room service appropriate?: Yes; Fluid consistency:: Thin . Will decrease IVF to 30/hr. Once PO intake stable, will d.c IVF.  No antithrombotic prior to admission, no aspirin at this time due to hemorrhage.  Per boyfriend, pt had a stroke 6 weeks ago in [place] state thought to be related to a growth on her MV for which the doctors recommended surgery, which pt refused. Was treated with lovenox x 1 week, then stopped.  Consulted cardiothoracic surgery consult (Dr. 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 SNF planned for rehab. SW following.  Disposition: pending   Cerebral edema  CT showed midline shift  No uncal herniation  Neuro stable   Continue monitoring  Subacute embolic infarct in [id number]  Per boyfriend, pt had a stroke 6 weeks ago in [place] thought to be related to a growth on her MV for which the doctors recommended surgery, which pt refused. Was treated with lovenox x 1 week, then stopped.  Initial CT head - subacute left frontal infarct and R parietal hemorrhagic infarct which remains pathcy  hyperintense  Peripheral cyanosis  Possible Raynaud's vs embolic event  Bilateral tips of hands and feet ecchymotic, with splinter hemorrhages  Boyfriend reports this was present during previous hospitalization and MD's were not concerned  Labs - Anti-scleroderma antibody elevated at 3.7, ANA positive 1:320, centromere abx neg.  Hypercoagulable and other autoimmune work up - pending (lupus, Beta 2 glycoprotein, homo, CL ab, RF, ANCA); neg (sjogems, anti-DNA, anti-smith ab, C3, C4)  Dysphagia, resolved  Passed swallow eval  decrease IVF to 30/hr.   Once PO intake stable, will d.c IVF.  Hypertension  Elevated hospital D#5, BP 158-174/86-98 past 24h (02/24/2015 @ 2:19 PM)  As able to swallow, resume home norvasc  labetolol iv PRN  Hyperlipidemia  Home meds: lipitor 20  LDL unable to calculate, goal < 70  Resumed statin as able to swallow   Plan continue statin at discharge  Other Stroke Risk Factors  Hx stroke/TIA  Anemia - appears stable  Hospital day # [place] for Pager information 02/24/2015 12:28 PM   I, the attending vascular neurologist, have personally obtained a history, examined the patient, evaluated laboratory data, individually viewed imaging studies and agree with  radiology interpretations. Together with the NP/PA, we formulated the assessment and plan of care which reflects our mutual decision.  I have made any additions or clarifications directly to the above note and agree with the findings and plan as currently documented.   Neuro stable. Waiting for SNF vs. ICR. Passed swallow, still on low dose IVF and will monitoirng.   Rosalin Hawking, MD PhD Stroke Neurology 02/24/2015 10:56 PM     To contact Stroke Continuity provider, please refer to [email or web address] After hours, contact General Neurology

## 2015-02-24 NOTE — NC FL2 (Signed)
[place] LEVEL OF CARE SCREENING TOOL     IDENTIFICATION  Patient Name: Erin Galloway Birthdate: May 20, 1955 Sex: female Admission Date (Current Location): 02/19/2015  [facility] and [place] Number: [place] and Address:  [facility], [place] [address] [place], [place] [address]      Provider Number: [id number]  Attending Physician Name and Address:  Rosalin Hawking, MD  Relative Name and Phone Number:       Current Level of Care: Hospital Recommended Level of Care: [place] Prior Approval Number:    Date Approved/Denied:   PASRR Number:   [id number]   Discharge Plan: SNF    Current Diagnoses: Patient Active Problem List   Diagnosis Date Noted  . HLD (hyperlipidemia)   . Autoimmune disease ([place])   . Cardiac mass   . Cerebral hemorrhage ([place])   . Acute encephalopathy   . Stroke ([place]) 02/19/2015  . Stroke with cerebral ischemia ([place]) 02/19/2015  . Respiratory failure ([place])   . Essential hypertension     Orientation ACTIVITIES/SOCIAL BLADDER RESPIRATION    Self, Time, Situation, Place  Family supportive Incontinent, External catheter O2 (As needed)  BEHAVIORAL SYMPTOMS/MOOD NEUROLOGICAL BOWEL NUTRITION STATUS   (NONE)  (NONE) Continent Diet (DYS 2 THIN)  PHYSICIAN VISITS COMMUNICATION OF NEEDS Height & Weight Skin    Non-Verbally 5\' 4"  (162.6 cm) 139 lbs. Normal          AMBULATORY STATUS RESPIRATION    Assist extensive O2 (As needed)      Personal Care Assistance Level of Assistance  Bathing, Dressing, Feeding Bathing Assistance: Maximum assistance Feeding assistance: Limited assistance Dressing Assistance: Maximum assistance      Functional Limitations Info  Speech     Speech Info: Impaired       SPECIAL CARE FACTORS FREQUENCY  PT (By licensed PT), OT (By licensed OT), Speech therapy     PT Frequency: 4 OT Frequency: 3     Speech Therapy Frequency: 3     Additional Factors  Info  Code Status, Allergies Code Status Info: FULL CODE  Allergies Info: N/A           Current Medications (02/24/2015):  This is the current hospital active medication list Current Facility-Administered Medications  Medication Dose Route Frequency Provider Last Rate Last Dose  . 0.9 %  sodium chloride infusion   Intravenous Continuous Donzetta Starch, NP 30 mL/hr at 02/24/15 1230    . acetaminophen (TYLENOL) tablet 650 mg  650 mg Oral Q4H PRN Amie Portland, MD       Or  . acetaminophen (TYLENOL) suppository 650 mg  650 mg Rectal Q4H PRN Amie Portland, MD      . acetaminophen (TYLENOL) tablet 1,000 mg  1,000 mg Oral Q6H PRN Luanne Bras, MD       Or  . acetaminophen (TYLENOL) suppository 650 mg  650 mg Rectal Q6H PRN Luanne Bras, MD   650 mg at 02/22/15 0045  . antiseptic oral rinse (CPC / CETYLPYRIDINIUM CHLORIDE 0.05%) solution 7 mL  7 mL Mouth Rinse q12n4p Garvin Fila, MD   7 mL at 02/22/15 1600  . chlorhexidine (PERIDEX) 0.12 % solution 15 mL  15 mL Mouth Rinse BID Garvin Fila, MD   15 mL at 02/23/15 1022  . enoxaparin (LOVENOX) injection 40 mg  40 mg Subcutaneous Daily Donzetta Starch, NP   40 mg at 02/24/15 1140  . insulin aspart (novoLOG) injection 0-9  Units  0-9 Units Subcutaneous Q6H Rosalin Hawking, MD   0 Units at 02/22/15 1800  . labetalol (NORMODYNE,TRANDATE) injection 10 mg  10 mg Intravenous Q2H PRN Rosalin Hawking, MD      . ondansetron (ZOFRAN) injection 4 mg  4 mg Intravenous Q6H PRN Luanne Bras, MD      . pantoprazole (PROTONIX) injection 40 mg  40 mg Intravenous QHS Amie Portland, MD   40 mg at 02/23/15 2122     Discharge Medications: Please see discharge summary for a list of discharge medications.  Relevant Imaging Results:  Relevant Lab Results:  Recent Labs    Additional Information SSN [id number]  Glendon Axe, MSW, LCSWA [phone] 02/24/2015 1:08 PM

## 2015-02-24 NOTE — Progress Notes (Signed)
Nutrition Follow-up   INTERVENTION:  Recommend monitoring magnesium, potassium, and phosphorus daily for at least 3 days, MD to replete as needed, as pt is at risk for refeeding syndrome given NPO status >5 days.  Provide Ensure Enlive po BID until PO intake determined adequate, each supplement provides 350 kcal and 20 grams of protein    NUTRITION DIAGNOSIS:   Inadequate oral intake related to inability to eat as evidenced by NPO status.  Ongoing; diet advanced   GOAL:   Patient will meet greater than or equal to 90% of their needs  Unmet  MONITOR:   PO intake, Supplement acceptance, Labs, Weight trends, Skin, I & O's  REASON FOR ASSESSMENT:   Ventilator, Malnutrition Screening Tool    ASSESSMENT:   59 y.o. female with a past medical history significant for HTN, ischemic stroke September 2016 with mild residual right sided weakness, brought to the ED by EMS due to acute onset of right hemiparesis, aphasia, left gaze preference.   Pt was extubated on 12/2, but remained NPO until today due to dysphagia. Pt is now on Dysphagia 2 diet with thin liquids. She has been sleepy per nurse tech and had not yet eaten at time of visit. Pt has been NPO since 11/30. Pt nonverbal and unresponsive to yes/no questions.   Labs: low hemoglobin, low potassium  Diet Order:  DIET DYS 2 Room service appropriate?: Yes; Fluid consistency:: Thin  Skin:  Reviewed, no issues  Last BM:  12/4  Height:   Ht Readings from Last 1 Encounters:  02/19/15 5\' 4"  (1.626 m)    Weight:   Wt Readings from Last 1 Encounters:  02/19/15 139 lb (63.05 kg)    Ideal Body Weight:  54.5 kg  BMI:  Body mass index is 23.85 kg/(m^2).  Estimated Nutritional Needs:   Kcal:  1600-1800  Protein:  65-75 grams  Fluid:  1.6-1.8 L/day  EDUCATION NEEDS:   No education needs identified at this time  [place], LDN Inpatient Clinical Dietitian Pager: [phone] After Hours Pager: [phone]

## 2015-02-25 ENCOUNTER — Other Ambulatory Visit: Payer: Self-pay | Admitting: Neurology

## 2015-02-25 DIAGNOSIS — R4702 Dysphasia: Secondary | ICD-10-CM

## 2015-02-25 DIAGNOSIS — R339 Retention of urine, unspecified: Secondary | ICD-10-CM

## 2015-02-25 DIAGNOSIS — R4701 Aphasia: Secondary | ICD-10-CM | POA: Diagnosis present

## 2015-02-25 DIAGNOSIS — E041 Nontoxic single thyroid nodule: Secondary | ICD-10-CM | POA: Diagnosis present

## 2015-02-25 DIAGNOSIS — I639 Cerebral infarction, unspecified: Secondary | ICD-10-CM

## 2015-02-25 LAB — BETA-2-GLYCOPROTEIN I ABS, IGG/M/A

## 2015-02-25 LAB — CBC
HCT: 31 % — ABNORMAL LOW (ref 36.0–46.0)
Hemoglobin: 10.3 g/dL — ABNORMAL LOW (ref 12.0–15.0)
MCH: 27 pg (ref 26.0–34.0)
MCHC: 33.2 g/dL (ref 30.0–36.0)
MCV: 81.4 fL (ref 78.0–100.0)
Platelets: 497 10*3/uL — ABNORMAL HIGH (ref 150–400)
RBC: 3.81 MIL/uL — ABNORMAL LOW (ref 3.87–5.11)
RDW: 13.4 % (ref 11.5–15.5)
WBC: 11.2 10*3/uL — ABNORMAL HIGH (ref 4.0–10.5)

## 2015-02-25 LAB — BASIC METABOLIC PANEL
Anion gap: 10 (ref 5–15)
BUN: 9 mg/dL (ref 6–20)
CALCIUM: 9.3 mg/dL (ref 8.9–10.3)
CO2: 27 mmol/L (ref 22–32)
CREATININE: 0.61 mg/dL (ref 0.44–1.00)
Chloride: 100 mmol/L — ABNORMAL LOW (ref 101–111)
GFR calc non Af Amer: >60 mL/min (ref >60)
Glucose, Bld: 127 mg/dL — ABNORMAL HIGH (ref 65–99)
Potassium: 2.8 mmol/L — ABNORMAL LOW (ref 3.5–5.1)
SODIUM: 137 mmol/L (ref 135–145)

## 2015-02-25 LAB — CARDIOLIPIN ANTIBODIES, IGG, IGM, IGA
Anticardiolipin IgG: <9 GPL U/mL (ref 0–14)
Anticardiolipin IgM: <9 MPL U/mL (ref 0–12)

## 2015-02-25 MED ORDER — PANTOPRAZOLE SODIUM 40 MG PO PACK: 40.0000 mg | PACK | Freq: Every day | ORAL | Status: DC

## 2015-02-25 NOTE — Evaluation (Signed)
Occupational Therapy Evaluation Patient Details Name: Erin Galloway MRN: [id number] DOB: Mar 16, 1956 Today's Date: 02/25/2015    History of Present Illness Erin Galloway is an 59 y.o. 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  Post tPA hemorrhage Rt parietal and Lt temporal and occipital lobes.  CT on 12/1 and 12/2 demonstrated stable Lt hemorrhages and decreasing Rt parietal hemorrhage.     Clinical Impression   Focus of session on facilitating R side awareness of visual field and UE, grooming and neuromuscular rehab.   Pt able to turn her head 30 degrees toward R by end of session. Indicating yes/no with head nod, but not consistently reliable. Hand over hand assist for all attempted grooming tasks with pt demonstrating significant apraxia. Pt requiring use of suction with toothbrushing, unable to motor plan spitting.    Follow Up Recommendations  SNF;Supervision/Assistance - 24 hour    Equipment Recommendations       Recommendations for Other Services       Precautions / Restrictions Precautions Precautions: Fall Precaution Comments: dense R hemiplegia      Mobility Bed Mobility               General bed mobility comments: pt in chair  Transfers                      Balance                                            ADL Overall ADL's : Needs assistance/impaired     Grooming: Brushing hair;Oral care;Wash/dry face;Wash/dry hands;Total assistance (applied lotion to R UE)                                 General ADL Comments: Incorporated head turn to look for  ADL items on tray toward R of midline. Pt attempting to use toothbrush in hair and comb in mouth.  Hand over hand assist to peform grooming.  By end of session, pt able to turn her head 30 degrees to R to locate OTs face/voice.     Vision Additional Comments: Pt with disconjugate gaze, unable to move eyes without head movement   Perception     Praxis      Pertinent Vitals/Pain Pain Assessment: Faces Faces Pain Scale: No hurt     Hand Dominance     Extremity/Trunk Assessment             Communication     Cognition Arousal/Alertness: Awake/alert Behavior During Therapy: Flat affect Overall Cognitive Status: Impaired/Different from baseline Area of Impairment: Following commands       Following Commands: Follows one step commands inconsistently (with multimodal and context cues)     Problem Solving: Slow processing;Decreased initiation;Difficulty sequencing;Requires verbal cues;Requires tactile cues     General Comments       Exercises Exercises: Other exercises Other Exercises Other Exercises: with fingers laced, performed ROM R UE x 5--shoulder flexion to 90, elbow flexion and extension, required total assist  Shoulder Instructions      Home Living                                          Prior Functioning/Environment               OT Diagnosis:     OT Problem List:     OT Treatment/Interventions:      OT Goals(Current goals can be found in the care plan section) Acute Rehab OT Goals Patient Stated Goal: unable to state due to expressive difficulties  OT Frequency: Min 3X/week   Barriers to D/C:            Co-evaluation              End of Session    Activity Tolerance: Patient tolerated treatment well Patient left: in chair;with call bell/phone within reach;with chair alarm set   Time: 1041-1109 OT Time Calculation (min): 28 min Charges:  OT General Charges $OT Visit: 1 Procedure OT Treatments $Self  Care/Home Management : 8-22 mins $Neuromuscular Re-education: 8-22 mins G-Codes:    Malka So 02/25/2015, 11:21 AM [phone]

## 2015-02-25 NOTE — Progress Notes (Signed)
Physical Therapy Treatment Patient Details Name: Erin Galloway MRN: [id number] DOB: 02-13-1956 Today's Date: 02/25/2015    History of Present Illness Erin Galloway is an 59 y.o. 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  Post tPA hemorrhage Rt parietal and Lt temporal and occipital lobes.  CT on 12/1 and 12/2 demonstrated stable Lt hemorrhages and decreasing Rt parietal hemorrhage.      PT Comments    Patient progressing with both sitting balance and standing and stepping activities this session.  Still tuned out to using familiar grooming items despite time and assist.  Feel SNF level rehab most appropriate for best long term recovery.  Will continue skilled PT in the acute setting to progress as tolerated.  Follow Up Recommendations  SNF;Supervision/Assistance - 24 hour     Equipment Recommendations  Other (comment) (TBA)    Recommendations for Other Services       Precautions / Restrictions Precautions Precautions: Fall Precaution Comments: R increased tone    Mobility  Bed Mobility Overal bed mobility: Needs Assistance Bed Mobility: Supine to Sit       Sit to supine: Max assist   General bed mobility comments: guiding legs to edge of bed and lifting trunk upright (pt did not attempt to pull up with commands, and increased timed)  Transfers Overall transfer level: Needs assistance Equipment used: 2 person hand held assist Transfers: Sit to/from Stand Sit to Stand: Mod assist;+2 physical assistance Stand pivot transfers: +2 physical assistance;Mod assist;Max assist       General transfer comment: assist  on either side, pt standing several moments with assist for weight shift, cues and assist to progress R LE and increased time and assist initially to move L leg; facilitation for lateral weight shift, strong input into pelvis for anterior weight shfit.  Ambulation/Gait                 Stairs            Wheelchair Mobility    Modified Rankin (Stroke Patients Only) Modified Rankin (Stroke Patients Only) Pre-Morbid Rankin Score: No significant disability Modified Rankin: Severe disability     Balance Overall balance assessment: Needs assistance Sitting-balance support: Feet supported;No upper extremity supported;Single extremity supported Sitting balance-Leahy Scale: Poor Sitting balance - Comments: seated edge of bed about 15 minutes.  Patient initially with posterior bias and occasional L UE use on rail with cues to correct.  After about 5 minutes able to sit with supervision and participate in functional activities like using mouth swab and combing hair (with hand over hand A) with out LOB Postural control: Posterior lean;Left lateral lean Standing balance support: Bilateral upper extremity supported Standing balance-Leahy Scale: Poor Standing balance comment: stood at bedside with bilat UE support and support at R knee standing erect with +2 mod  A for safety                    Cognition Arousal/Alertness: Awake/alert Behavior During Therapy: Flat affect Overall Cognitive Status: Impaired/Different from baseline Area of Impairment: Following commands       Following Commands: Follows one step commands inconsistently (with increased time and multimodal cues) Safety/Judgement: Decreased awareness of safety;Decreased  awareness of deficits   Problem Solving: Slow processing;Decreased initiation;Difficulty sequencing;Requires verbal cues;Requires tactile cues      Exercises General Exercises - Upper Extremity Shoulder Flexion: PROM;Right;5 reps Elbow  Flexion: PROM;Right;5 reps Elbow Extension: PROM;Right Wrist Flexion: PROM;Right;5 reps Wrist Extension: PROM;Right;5 reps Digit Composite Flexion: PROM;Right;5 reps General Exercises - Lower Extremity Ankle Circles/Pumps: PROM;Right;5 reps Heel Slides: PROM;Right;5 reps Other Exercises Other Exercises: with fingers laced, performed ROM R UE x 5--shoulder flexion to 90, elbow flexion and extension, required total assist    General Comments        Pertinent Vitals/Pain Pain Assessment: Faces Faces Pain Scale: No hurt    Home Living                      Prior Function            PT Goals (current goals can now be found in the care plan section) Acute Rehab PT Goals Patient Stated Goal: unable to state due to expressive difficulties Time For Goal Achievement: 03/08/15 Potential to Achieve Goals: Good Progress towards PT goals: Progressing toward goals    Frequency  Min 4X/week    PT Plan Current plan remains appropriate    Co-evaluation             End of Session Equipment Utilized During Treatment: Gait belt Activity Tolerance: Patient tolerated treatment well Patient left: in chair;with call bell/phone within reach;with chair alarm set     Time: [id number] PT Time Calculation (min) (ACUTE ONLY): 29 min  Charges:  $Therapeutic Activity: 23-37 mins                    G Codes:      Natalye Kott,CYNDI Mar 09, 2015, 11:32 AM  Magda Kiel, PT [phone] March 09, 2015

## 2015-02-25 NOTE — Progress Notes (Signed)
Speech Language Pathology Treatment: Dysphagia;Cognitive-Linquistic  Patient Details Name: Erin Galloway MRN: [id number] DOB: Aug 05, 1955 Today's Date: 02/25/2015 Time: [id number] SLP Time Calculation (min) (ACUTE ONLY): 25 min  Assessment / Plan / Recommendation Clinical Impression  Skilled treatment session targeted dysphagia goals following yesterday's objective assessment.  SLP facilitated session by reducing environmental distractions and providing set-up of Dys1, Dys.2 textures and thin liquids via straw.  Patient required hand-over-hand assist to problem solve self-feeding, as well as extra time for mastication and oral clearance of Dys.2 textures.  Patient consumed single straw sips with likely delay, 2 swallows and no overt s/s of aspiration.  Recommend to continue with current diet orders and full supervision.    Session also focused on addressing cognitive-linguistic goals.  SLP provided the context of a familiar task as well as hand over hand assist faded to Max assist verbal, visual and tactile cues to initiate and problem solve use of cup.  Patient also required hand-over-hand assist to demonstrate appropriate use of basic ADL objects for grooming.  Patient with occasional spontaneous vocalizations during today's session as well as head nods for yes with 40% accuracy for basic yes/no biographical and environmental questions.        HPI HPI: Erin Galloway is an 59 y.o. female with a past medical history significant for HTN, ischemic stroke last September with mild residual right sided weakness, brought to the ED by EMS due to acute onset of right hemiparesis, aphasia and left gaze preference.  Husband said that they were in bed talking when suddenly she became confused, unable to speak and shortly thereafter couldn't move the right side and was constantly looking to the left. EMS was called and patient brought to the ED.      SLP Plan  Goals updated     Recommendations  Diet  recommendations: Dysphagia 2 (fine chop);Thin liquid Liquids provided via: Straw Medication Administration: Crushed with puree Supervision: Patient able to self feed;Staff to assist with self feeding;Full supervision/cueing for compensatory strategies Compensations: Minimize environmental distractions;Slow rate;Small sips/bites;Follow solids with liquid Postural Changes and/or Swallow Maneuvers: Seated upright 90 degrees;Out of bed for meals              General recommendations: Rehab consult Oral Care Recommendations: Oral care BID Follow up Recommendations: Inpatient Rehab;24 hour supervision/assistance Plan: Goals updated  Carmelia Roller., [facility] 02/25/2015, 10:18 AM

## 2015-02-25 NOTE — Clinical Social Work Note (Signed)
[facility] has a bed at [facility]. Family notified and would like to discuss option before making final decision. CSW explained that this is currently the only option due to patient having [place] Medicaid. Significant other, Ray expressed understanding however would like to further discuss placement with pt's mother.   MD/NP notified.   CSW remains available as needed.   Glendon Axe, MSW, LCSWA [phone] 02/25/2015 1:48 PM

## 2015-02-25 NOTE — Clinical Social Work Note (Signed)
Clinical Social Worker facilitated patient discharge including contacting patient family (mother, Earlie Server and significant other, Everlena Cooper) and facility to confirm patient discharge plans.  Clinical information faxed to facility and family agreeable with plan.  CSW arranged ambulance transport via [facility] to [facility] and [facility].  RN to call report prior to discharge.  Clinical Social Worker will sign off for now as social work intervention is no longer needed. Please consult [place] again if new need arises.  Glendon Axe, MSW, LCSWA [phone] 02/25/2015 2:41 PM

## 2015-02-25 NOTE — Progress Notes (Signed)
[facility] transported pt from unit without incident. Discharge paperwork explained and given to patient. Pt unable to sign due to condition. Bobbye Charleston, RN

## 2015-02-25 NOTE — Discharge Summary (Signed)
Stroke Discharge Summary  Patient ID: Erin Galloway   MRN: [id number]      DOB: 1956-02-24  Date of Admission: 02/19/2015 Date of Discharge: 02/25/2015  Attending Physician:  Rosalin Hawking, MD, Stroke MD  Consulting Physician(s):    Rexene Alberts, MD (cardiovascular surgery), Jennet Maduro, MD (pulmonary/intensive care) Patient's PCP:  No PCP Per Patient  DISCHARGE DIAGNOSIS:  Principal Problem:   Stroke with cerebral ischemia ([place])  large left MCA large infarcts, patchy R frontal, parietal and occipital infarcts and bilateral cerebellar infarcts s/p IV tPA & revascularizaion Active Problems:   Respiratory failure ([place])   Essential hypertension   Cerebral hemorrhage ([place]) - post tPA hemorrhage   Acute encephalopathy   HLD (hyperlipidemia)   Autoimmune disease ([place])   Cardiac mass   Urinary retention   Global aphasia   Thyroid nodule  BMI: Body mass index is 23.85 kg/(m^2).  Past Medical History  Diagnosis Date  . Stroke [facility]) 2016    September   . Hypertension    Past Surgical History  Procedure Laterality Date  . Induced abortion      Two abortions   . Radiology with anesthesia N/A 02/19/2015    Procedure: RADIOLOGY WITH ANESTHESIA;  Surgeon: Luanne Bras, MD;  Location: [place];  Service: Radiology;  Laterality: N/A;      Medication List    STOP taking these medications        aspirin-acetaminophen-caffeine 250-250-65 MG tablet  Commonly known as:  EXCEDRIN MIGRAINE      TAKE these medications        amLODipine 5 MG tablet  Commonly known as:  NORVASC  Take 5 mg by mouth daily.     atorvastatin 20 MG tablet  Commonly known as:  LIPITOR  Take 20 mg by mouth daily.        LABORATORY STUDIES CBC    Component Value Date/Time   WBC 11.2* 02/25/2015 0600   RBC 3.81* 02/25/2015 0600   HGB 10.3* 02/25/2015 0600   HCT 31.0* 02/25/2015 0600   PLT 497* 02/25/2015 0600   MCV 81.4 02/25/2015 0600   MCH 27.0 02/25/2015 0600   MCHC 33.2 02/25/2015  0600   RDW 13.4 02/25/2015 0600   LYMPHSABS 2.7 02/22/2015 0348   MONOABS 1.1* 02/22/2015 0348   EOSABS 0.3 02/22/2015 0348   BASOSABS 0.0 02/22/2015 0348   CMP    Component Value Date/Time   NA 137 02/25/2015 0600   K 2.8* 02/25/2015 0600   CL 100* 02/25/2015 0600   CO2 27 02/25/2015 0600   GLUCOSE 127* 02/25/2015 0600   BUN 9 02/25/2015 0600   CREATININE 0.61 02/25/2015 0600   CALCIUM 9.3 02/25/2015 0600   PROT 7.5 02/19/2015 0404   ALBUMIN 2.7* 02/22/2015 0348   AST 23 02/19/2015 0404   ALT 16 02/19/2015 0404   ALKPHOS 100 02/19/2015 0404   BILITOT 0.4 02/19/2015 0404   GFRNONAA >60 02/25/2015 0600   GFRAA >60 02/25/2015 0600   COAGS Lab Results  Component Value Date   INR 1.11 02/19/2015   Lipid Panel    Component Value Date/Time   CHOL 216* 02/20/2015 0330   TRIG 187* 02/22/2015 0348   HDL 27* 02/20/2015 0330   CHOLHDL 8.0 02/20/2015 0330   VLDL UNABLE TO CALCULATE IF TRIGLYCERIDE OVER 400 mg/dL 02/20/2015 0330   LDLCALC UNABLE TO CALCULATE IF TRIGLYCERIDE OVER 400 mg/dL 02/20/2015 0330   HgbA1C  Lab Results  Component Value Date   HGBA1C  6.0* 02/21/2015   Cardiac Panel (last 3 results) No results for input(s): CKTOTAL, CKMB, TROPONINI, RELINDX in the last 72 hours. Urinalysis No results found for: COLORURINE, APPEARANCEUR, LABSPEC, PHURINE, GLUCOSEU, HGBUR, BILIRUBINUR, KETONESUR, PROTEINUR, UROBILINOGEN, NITRITE, LEUKOCYTESUR Urine Drug Screen No results found for: LABOPIA, COCAINSCRNUR, LABBENZ, AMPHETMU, THCU, LABBARB  Alcohol Level No results found for: ETH   SIGNIFICANT DIAGNOSTIC STUDIES Ct Head Wo Contrast 02/23/2015 Worsening cytotoxic edema throughout the large area of LEFT MCA infarction with early LEFT-to-RIGHT shift of 4 mm. No uncal herniation at this time but continued surveillance is warranted. No progression multiple areas hemorrhagic transformation. 02/21/2015  Evolving large LEFT MCA territory infarct with petechial hemorrhage. Two  similar hematomas within the LEFT temporal occipital lobe. Multifocal small areas of acute ischemia and petechial hemorrhage, RIGHT cerebrum. Small cerebellar infarcts better seen on prior MRI. 02/20/2015  1. Evolving large MCA territory infarct and additional patchy right cerebral infarcts. Findings are better delineated on MRI of the brain performed on the same day.  2. Stable posterior left temporal and occipital hemorrhages.  3. Decreased conspicuity of small volume right parietal hemorrhage. 02/19/2015 1656  1. Stable appearance of posterior left temporal hemorrhages.  2. Evolving large left MCA territory infarct.  3. Densities in the right frontal and parietal lobes likely represents evolving infarcts with decreasing parenchymal enhancement. 4. Small right parietal hemorrhage is stable. 02/19/2015 0907  1. Hemorrhagic conversion in the left occipital lobe infarcts with two hematomas up to 22 mm diameter.  2. Multi focal bilateral cerebral convexity high density is likely enhancement from subacute infarct. Pattern suggests recurrent central embolic disease.  02/19/2015 0428 Remote infarctions with encephalomalacia. Generalized atrophy and chronic white matter changes consistent with small vessel disease. No acute intracranial findings.   CTA NECK  02/19/2015  1. Atheromatous plaque about the left carotid bifurcation/proximal left ICA with associated short segment stenosis of approximately 40-50% by NASCET criteria.  2. Mild atheromatous plaque about the right carotid bifurcation/proximal right ICA without hemodynamically significant stenosis. 3. 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 The dominant right vertebral artery is widely patent along its entire course.   CTA HEAD  02/19/2015  1. Short segment focal defect within the mid left M1 segment, worrisome for nonocclusive thrombus. There may be a superimposed  stenosis at this level as well. Flow is present within the left M1 segment distally as well as the proximal M2 branches, however, the MCA branches are markedly attenuated and possibly occluded distally.  2. Atheromatous disease within the cavernous ICAs with associated mild to moderate multi focal irregularity and stenosis.  3. Irregularity with multi focal moderate to severe narrowing of the left V4 segment. Dominant right vertebral artery widely patent.   Cerebral Angiogram 02/19/2015 S/P Lt common carotid arteriogram,followed by complete revascularization of occluded LT LMCA M 1 With x1 pass with 89mm x 30 mm Trevoprovue retrieval device and 2.5 mg of superselective intracranial TPA  TICI 3 flow restored  MRI head 02/20/2015  1. Evolving large confluent left MCA territory infarct, with additional patchy infarcts within the right frontal, parietal, and occipital regions, with additional small scattered bilateral cerebellar infarcts.  2. Stable hematomas within the left temporal and occipital lobes, consistent with hemorrhagic transformation. Small volume hemorrhage in the right parietal region also stable.  Chest Xray 02/20/2015 Patchy density peripherally in the left lower lung likely reflecting atelectasis or early pneumonia. The endotracheal tube is in reasonable position radiographically.  2D echo  -  Left ventricle: The cavity size was normal. Wall thickness wasincreased in a pattern of mild LVH. Systolic function was normal.The estimated ejection fraction was in the range of 60% to 65%.Wall motion was normal; there were no regional wall motionabnormalities. Doppler parameters are consistent with abnormalleft ventricular relaxation (grade 1 diastolic dysfunction). - Mitral valve: There was mild regurgitation. - Pulmonary arteries: Systolic pressure was moderately increased.PA peak pressure: 44 mm Hg (S).  TEE 0.6 x 0.5 cm mitral valve mass on the tip of the anteriorleaflet,  suggestive of papillary fibroelastoma - vegetation,thrombus or Libman-Sacks endocarditis are less likely findings. The mitral valve is notable for a shaggy, non-pedunculated round mass noted at the tip of the anterior mitral leaflet (it measures about 0.5 cm x 0.6 cm). This is on the atrial side of the valve and may prolapse across the valve plane. It was visualized in 2D and 3D views. There is Mild regurgitation. There is no stenosis.     HISTORY OF PRESENT ILLNESS Pamie Tallmadge is an 59 y.o. female with a past medical history significant for HTN, ischemic stroke September 2016 with mild residual right sided weakness, brought to the ED by EMS due to acute onset of right hemiparesis, aphasia, left gaze preference. Husband said that they were in bed talking when suddenly she became confused, unable to speak and shortly thereafter couldn't move the right side and was constantly looking to the left .EMS was summoned and patient brought to the ED where she was noted to have NIHSS 19. CT brain showed no acute abnormality but remote infarction in the posterior left frontal lobe, rightparieto-occipital region and possibly a smaller area of remote infarction in the left occipital lobe. CTA brain with thrombus M1 segment left MCA and very poor collaterals. It is worth mentioning that patient was reportedly admitted to a local hospital in [place], [place] state with a similar presentation and by patient report she was treated with Lovenox and informed that the cause for that stroke was " a growth in her heart". In the ED, patient was awake, with global aphasia expressive>receptive. She was LKW 3 am 02/19/15. IV tPA was given, then sent to IR where she received TICI3 revascularization of the L M1 with trevo and 2.5 mg IA tPA. She was admitted to the medical ICU for further evaluation and treatment.    HOSPITAL COURSE Ms. Chloye Spicuzza is a 59 y.o. female with history of HTN and a stroke 6 weeks ago due to  MV abnormality for which she refused surgery presenting with right hemiparesis, aphasia and L gaze preference. She received IV t-PA 02/19/2015 at 0436 and complete revascularization of L M1 with trevo and IA tPA.   Stroke: large left MCA large infarcts, patchy R frontal, parietal and occipital infarcts and bilateral cerebellar infarcts s/p IV tPA and TICI3 revascularizaion of L M1 with mechanical embolectomy and IA tPA. 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 Hemorrhagic transformation at R parietal, L temorporal and occipital lobes.  Resultant aphasia, right hemiparesis, right neglect and L gaze preference  Post IR CT with L occipital hemorrhagic transformation.  MRI left MCA territory infarcts, patchy R frontal, parietal and occipital infarcts and bilateral cerebellar infarcts   Repeat CT head 12/1 and 12/2 stable - Left hemorrhagic transformation, decreasing R parietal hemorrhage  2D Echo No SOE reported. Upon review with cardiology, MV abnormality present.   TEE - 0.6x0.5 mitral valve mass on the anterior leaflet suggestive  of papillary fibroelastoma, but limbman-sacks endocarditis not ruled out yet  LDL unable to calculate, TG 450  HgbA1c 6.4  No antithrombotic prior to admission, no aspirin at this time due to hemorrhage. However, pt needs anticoagulation for stroke prevention. Pt need to have CT head in about 3 weeks and then decide on anticoagulation. Will arrange for CT head in 3 week and clinic visit with Dr. Erlinda Hong in one month.  Per boyfriend, pt had a stroke 6 weeks ago in [place] state thought to be related to a growth on her MV for which the doctors recommended surgery, which pt refused. Was treated with lovenox x 1 week, then stopped.  Consulted cardiothoracic surgery consult (Dr. 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 SNF planned for rehab.   Disposition: Discharge to skilled nursing facility for ongoing PT, OT and ST.   Cerebral edema  CT showed mild midline shift  No uncal herniation  Neuro stable   Repeat CT in 3 weeks  Subacute embolic infarct in [id number]  Per boyfriend, pt had a stroke 6 weeks ago in [place] state thought to be related to a growth on her MV for which the doctors recommended surgery, which pt refused. Was treated with lovenox x 1 week, then stopped.  Initial CT head - subacute left frontal infarct and R parietal hemorrhagic infarct which remains pathcy hyperintense  Peripheral cyanosis  Possible Raynaud's vs embolic event  Bilateral tips of hands and feet ecchymotic, with splinter hemorrhages  Boyfriend reports this was present during previous hospitalization and MD's were not concerned  Labs - Anti-scleroderma antibody elevated at 3.7, ANA positive 1:320, centromere abx neg.  Hypercoagulable and other autoimmune work up - negative sjogems, anti-DNA, anti-smith ab, C3, C4, homo, RF, Beta 2 glycoprotein,  CL ab, but pending (lupus anticoagulant and ANCA);   Dysphagia, resolved  Passed swallow eval  Hypertension  resumed home norvasc  Improving on home meds  Long-term goal < 130/80  Hyperlipidemia  Home meds: lipitor 20  LDL unable to calculate, goal < 70  Resumed statin as able to swallow   Plan continue statin at discharge  Other Stroke Risk Factors  Hx stroke/TIA  Anemia - appears stable  Urinary retention  Foley placed 12/3  D/c foley, assess urination  Thyroid nodule  Noted in Sept 2016 hospitalization for previous stroke  Per boyfriend, Needs OP f/u - nothing addressed since that dyschage   DISCHARGE EXAM Blood pressure 153/79, pulse 105, temperature  98.5 F (36.9 C), temperature source Oral, resp. rate 18, height 5\' 4"  (1.626 m), weight 63.05 kg (139 lb), SpO2 95 %. General - Well nourished, well developed, in no apparent distress.  Ophthalmologic - Fundi not visualized due to noncooperation.  Cardiovascular - Regular rate and rhythm.  Extremities - cyanosis at the fingers bilaterally, but there are also splinter hemorrhages underneath fingernails.  Neuro - awake, alert, but aphasic, no speech outpt, able to intermittently follow some simple commands, but inconsistent. Not able to test naming or repeating. Right side neglect and left eye preference. PERRL, eyes able to cross midline. Right facial droop, LUE and LLE 4/5 and RLE withdraw to pain, RUE 0/5. DTR 1+ and right babinski positive.    Discharge Diet   DIET DYS 2 Room service  appropriate?: Yes; Fluid consistency:: Thin liquids  DISCHARGE PLAN  Disposition:  Discharge to skilled nursing facility for ongoing PT, OT and ST.   No antithrombotics at this time secondary to hemorrhage. Plan coumadin once hemorrhage resolves, will need CT assessment of hemorrhage prior to coumadin administration.  Follow-up No PCP Per Patient in 2 weeks.  Follow-up with Dr. Rosalin Hawking, Stroke Clinic in one month.  45 minutes were spent preparing discharge.  Buckhorn Louisa for Pager information 02/25/2015 2:06 PM    I, the attending vascular neurologist, have personally obtained a history, examined the patient, evaluated laboratory data, individually viewed imaging studies and agree with radiology interpretations. Together with the NP/PA, we formulated the assessment and plan of care which reflects our mutual decision.  I have made any additions or clarifications directly to the above note and agree with the findings and plan as currently documented.    Rosalin Hawking, MD PhD Stroke Neurology 02/25/2015 5:26 PM

## 2015-02-25 NOTE — Clinical Social Work Placement (Signed)
   CLINICAL SOCIAL WORK PLACEMENT  NOTE  Date:  02/25/2015  Patient Details  Name: Erin Galloway MRN: [id number] Date of Birth: January 07, 1956  Clinical Social Work is seeking post-discharge placement for this patient at the [place] level of care (*CSW will initial, date and re-position this form in  chart as items are completed):  Yes   Patient/family provided with [place] Work Department's list of facilities offering this level of care within the geographic area requested by the patient (or if unable, by the patient's family).  Yes   Patient/family informed of their freedom to choose among providers that offer the needed level of care, that participate in Medicare, Medicaid or managed care program needed by the patient, have an available bed and are willing to accept the patient.  Yes   Patient/family informed of [facility]'s ownership interest in [facility] and [facility], as well as of the fact that they are under no obligation to receive care at these facilities.  PASRR submitted to EDS on 02/24/15     PASRR number received on 02/24/15     Existing PASRR number confirmed on       FL2 transmitted to all facilities in geographic area requested by pt/family on 02/24/15     FL2 transmitted to all facilities within larger geographic area on 02/24/15     Patient informed that his/her managed care company has contracts with or will negotiate with certain facilities, including the following:        Yes   Patient/family informed of bed offers received.  Patient chooses bed at  [facility] and [place] )     Physician recommends and patient chooses bed at      Patient to be transferred to  [facility] and [place] ) on 02/25/15.  Patient to be transferred to facility by  [facility] )     Patient family notified on 02/25/15 of transfer.  Name of family member notified:   (Pt's significant other, Ray and mother, Earlie Server )      PHYSICIAN Please sign FL2     Additional Comment:    _______________________________________________ Rozell Searing, LCSW 02/25/2015, 2:35 PM

## 2015-02-26 LAB — ANCA TITERS
Atypical P-ANCA titer: <1:20 {titer}
C-ANCA: <1:20 {titer}
P-ANCA: <1:20 {titer}

## 2015-03-04 LAB — LUPUS ANTICOAGULANT PANEL
DRVVT: 47.4 s — AB (ref 0.0–44.0)
PTT LA: 44.1 s — AB (ref 0.0–40.6)

## 2015-03-04 LAB — PTT-LA MIX: PTT-LA Mix: 41.2 s — ABNORMAL HIGH (ref 0.0–40.6)

## 2015-03-04 LAB — HEXAGONAL PHASE PHOSPHOLIPID: Hexagonal Phase Phospholipid: 6 s (ref 0–11)

## 2015-03-04 LAB — DRVVT MIX: dRVVT Mix: 42.2 s (ref 0.0–44.0)

## 2015-03-18 ENCOUNTER — Telehealth (HOSPITAL_COMMUNITY): Payer: Self-pay

## 2015-03-18 NOTE — Telephone Encounter (Signed)
Called to schedule f/u, left message for pt to call back. AW

## 2015-03-27 ENCOUNTER — Inpatient Hospital Stay: Admit: 2015-03-27 | Payer: Self-pay | Admitting: Internal Medicine

## 2015-03-27 ENCOUNTER — Inpatient Hospital Stay
Admission: AD | Admit: 2015-03-27 | Payer: Self-pay | Source: Other Acute Inpatient Hospital | Admitting: Internal Medicine

## 2015-04-23 DEATH — deceased

## 2015-06-03 ENCOUNTER — Other Ambulatory Visit: Payer: Self-pay

## 2015-06-03 NOTE — Patient Outreach (Signed)
Telephone outreach to patient to obtain mRS. Left message for return call.

## 2015-06-05 ENCOUNTER — Other Ambulatory Visit: Payer: Self-pay

## 2015-06-05 NOTE — Patient Outreach (Signed)
Second telephone outreach attempt to obtain mRS for patient. Left message for return call.    [name]  [facility] Care Management Assistant

## 2015-06-06 ENCOUNTER — Other Ambulatory Visit: Payer: Self-pay

## 2015-06-06 NOTE — Patient Outreach (Signed)
Third and final telephone outreach attempt to obtain mRS. Left compliant message for return phone call.    [name]  [facility] Care Management Assistant

## 2015-06-10 NOTE — Patient Outreach (Signed)
[facility]) Care Management  06/10/2015  Erin Galloway 1956-02-02 [id number]   3 telephone outreach attempts were completed to obtain mRS for patient. mRS could not be obtained because messages left for the patient requesting a return phone call but no return call received.   [name]  [facility] Care Management Assistant

## 2017-03-05 IMAGING — CT CT HEAD W/O CM
2 series · 15 of 30 positions shown, 19 images · non-contrast
Comparison: Multiple priors, most recent CT 02/21/2015

CLINICAL DATA: Continued surveillance LEFT MCA stroke. RIGHT
hemiparesis. Status post thrombolysis.

EXAM:
CT HEAD WITHOUT CONTRAST
TECHNIQUE: Contiguous axial images were obtained from the base of the skull
through the vertex without intravenous contrast.

[Series 201: head w/o, idose (1) · axial · non-contrast · 0.42mm/px · z∈[+108,+233]mm · 13 of 31 slices shown, 17 images]
[im 3/31  brain]
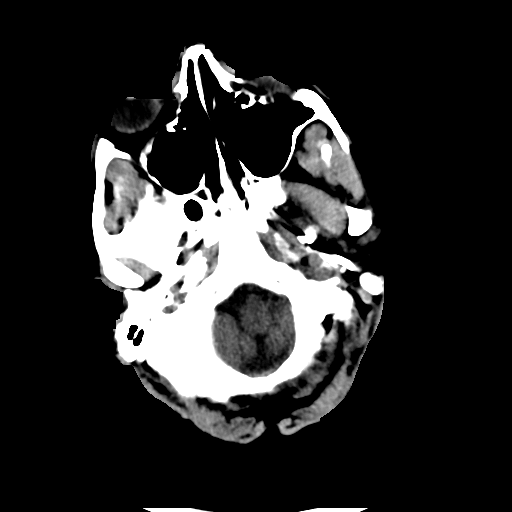
[im 3/31  bone]
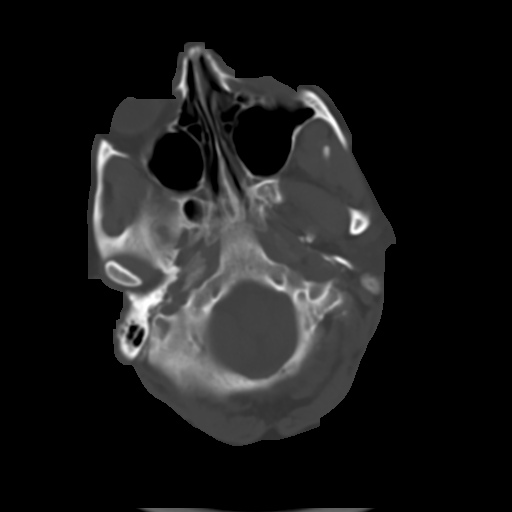
[im 5/31  brain]
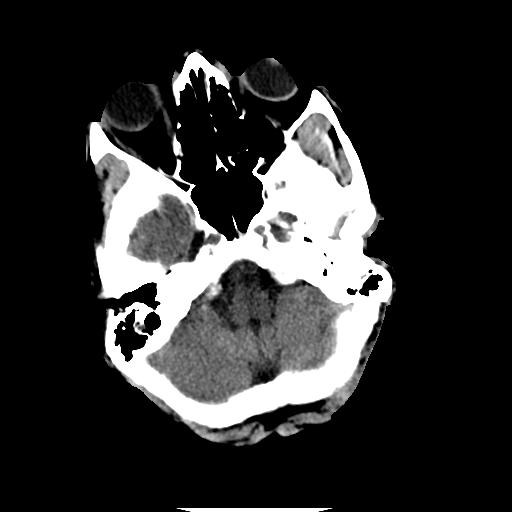
[im 7/31  brain]
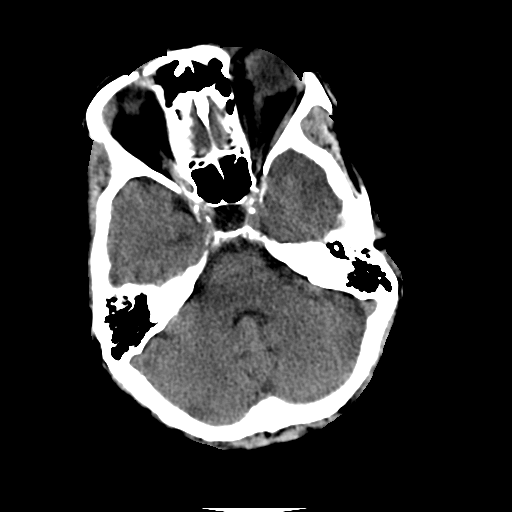
[im 9/31  brain]
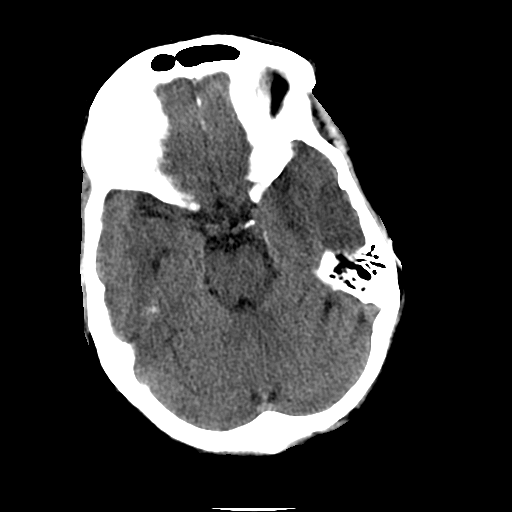
[im 11/31  brain]
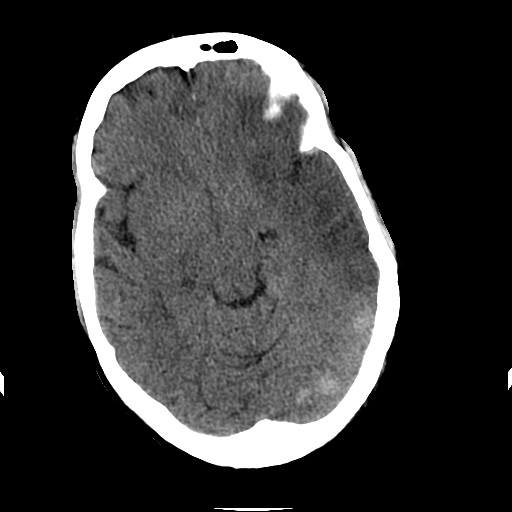
[im 11/31  bone]
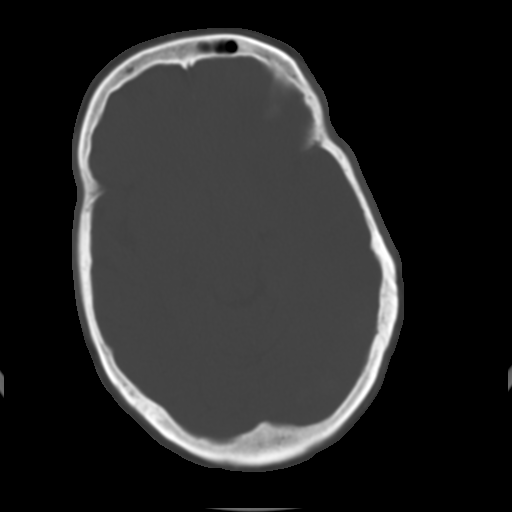
[im 13/31  brain]
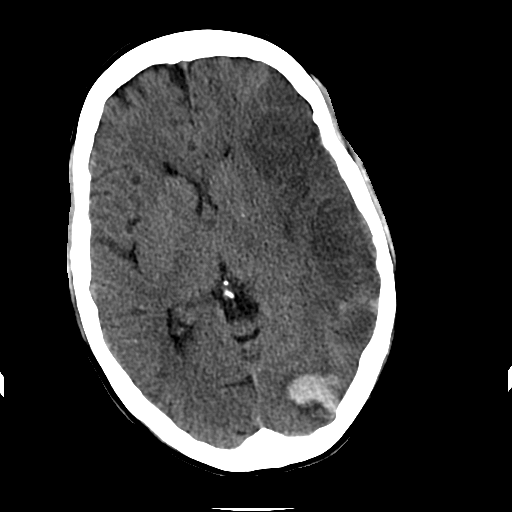
[im 16/31  brain]
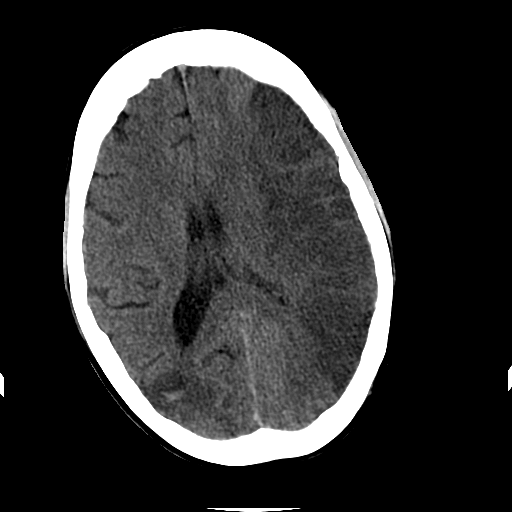
[im 18/31  brain]
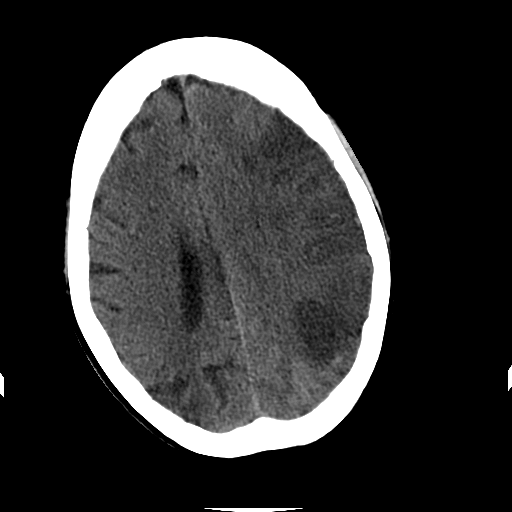
[im 20/31  brain]
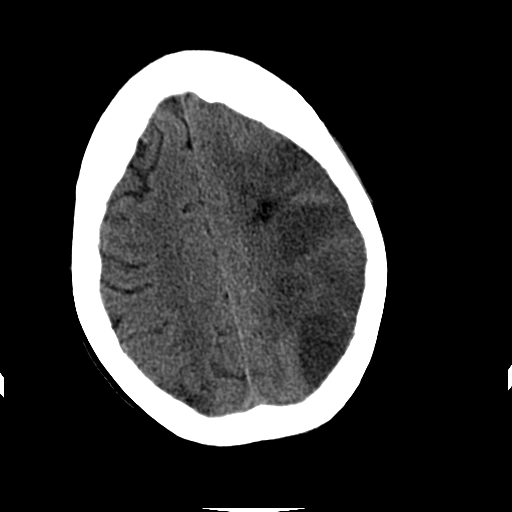
[im 20/31  bone]
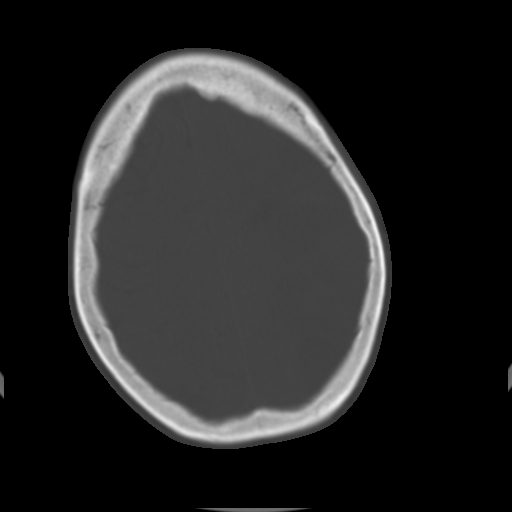
[im 22/31  brain]
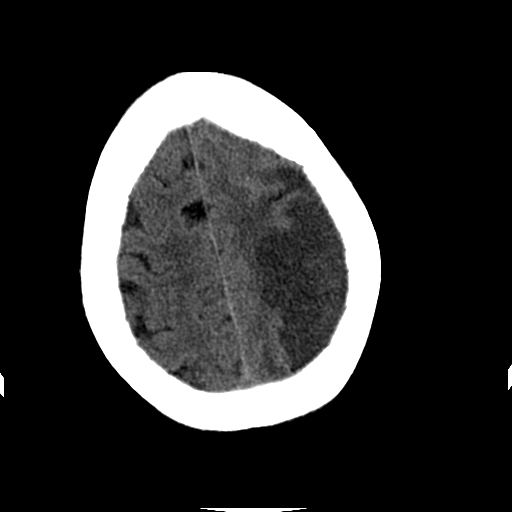
[im 24/31  brain]
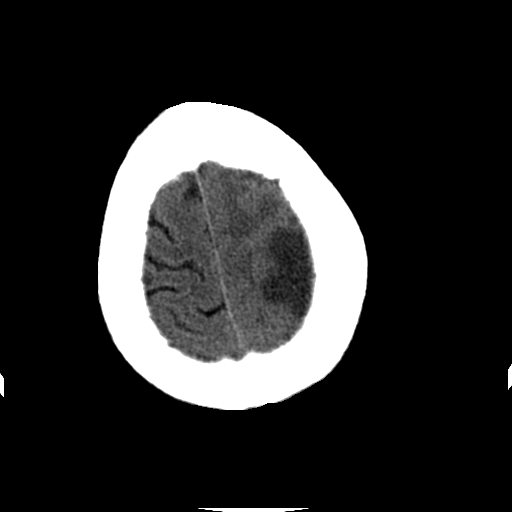
[im 26/31  brain]
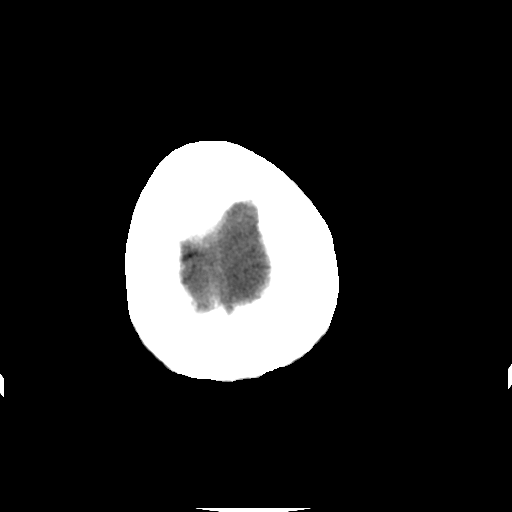
[im 28/31  brain]
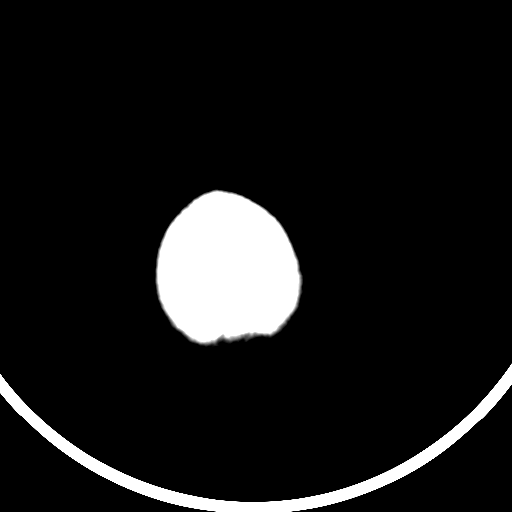
[im 28/31  bone]
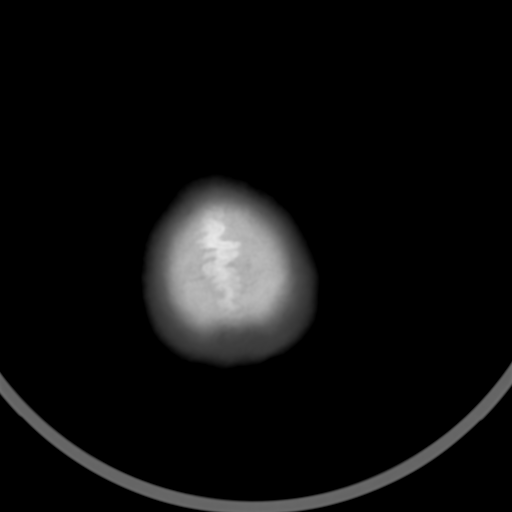

[Series 202: head w/o bone, idose (1) · axial · non-contrast · 0.42mm/px · z∈[+108,+128]mm · 2 of 31 slices shown]
[im 3/31  bone]
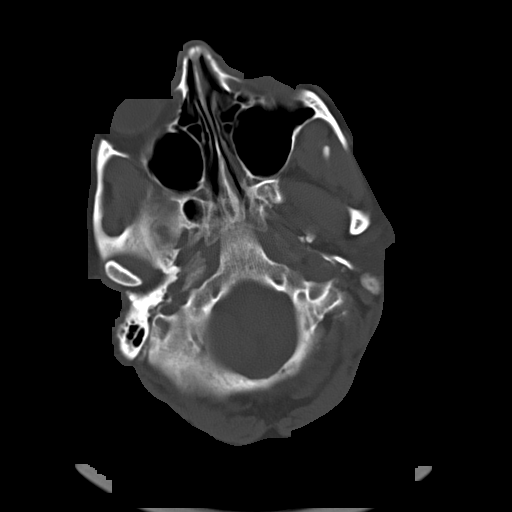
[im 7/31  bone]
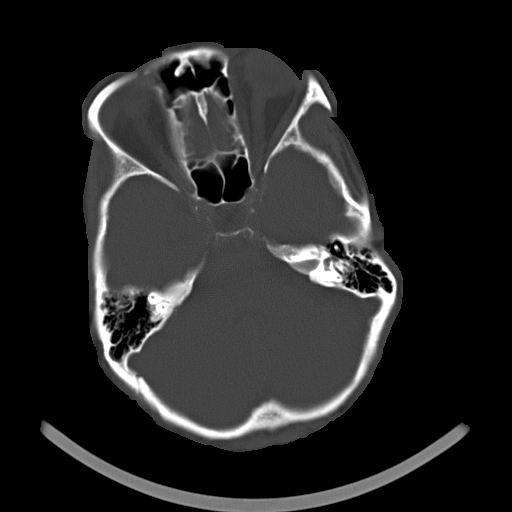

[15 of 30 positions shown; findings below may reference images not displayed]

FINDINGS: Increasingly well-defined hypoattenuation throughout the dominant
area of cerebral infarction involving the LEFT MCA territory as
previously described. Worsening cytotoxic edema has now resulted in
4 mm of LEFT-to-RIGHT shift measured at the septum pellucidum.
Continued surveillance is warranted to observe for possible uncal
herniation the LEFT.

Areas of hemorrhagic transformation involving LEFT temporal lobe and
LEFT occipital lobe are redemonstrated and stable, not significantly
progressed.

Increasingly well-defined cytotoxic edema of the RIGHT parietal
infarct, with small volume hemorrhagic transformation is seen, for
instance image 16 series 201. Small areas of cortical and
subcortical infarction involving the RIGHT frontal lobe are less
well seen, better identified on MR, without significant worsening or
apparent increase in number.

Calvarium remains intact.  No sinus or mastoid disease.
IMPRESSION: Worsening cytotoxic edema throughout the large area of LEFT MCA
infarction with early LEFT-to-RIGHT shift of 4 mm. No uncal
herniation at this time but continued surveillance is warranted.

No progression multiple areas hemorrhagic transformation.
